# Patient Record
Sex: Female | Born: 1940 | Race: White | Hispanic: No | Marital: Married | Smoking: Former smoker
Health system: Southern US, Community
[De-identification: ages and names within clinical notes are randomized; demographics above are authoritative.]

## PROBLEM LIST (undated history)

## (undated) DIAGNOSIS — E785 Hyperlipidemia, unspecified: Secondary | ICD-10-CM

## (undated) DIAGNOSIS — K635 Polyp of colon: Secondary | ICD-10-CM

## (undated) DIAGNOSIS — I10 Essential (primary) hypertension: Secondary | ICD-10-CM

## (undated) DIAGNOSIS — N200 Calculus of kidney: Secondary | ICD-10-CM

## (undated) DIAGNOSIS — B019 Varicella without complication: Secondary | ICD-10-CM

## (undated) DIAGNOSIS — M1990 Unspecified osteoarthritis, unspecified site: Secondary | ICD-10-CM

## (undated) DIAGNOSIS — T7840XA Allergy, unspecified, initial encounter: Secondary | ICD-10-CM

## (undated) HISTORY — DX: Varicella without complication: B01.9

## (undated) HISTORY — DX: Polyp of colon: K63.5

## (undated) HISTORY — DX: Hyperlipidemia, unspecified: E78.5

## (undated) HISTORY — DX: Unspecified osteoarthritis, unspecified site: M19.90

## (undated) HISTORY — DX: Allergy, unspecified, initial encounter: T78.40XA

## (undated) HISTORY — DX: Calculus of kidney: N20.0

## (undated) HISTORY — DX: Essential (primary) hypertension: I10

---
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
## 2009-05-14 DIAGNOSIS — K635 Polyp of colon: Secondary | ICD-10-CM

## 2009-05-14 HISTORY — DX: Polyp of colon: K63.5

## 2009-05-14 LAB — HM COLONOSCOPY

## 2010-06-19 LAB — HM DEXA SCAN

## 2011-10-30 LAB — HM MAMMOGRAPHY

## 2013-10-29 ENCOUNTER — Encounter: Payer: Self-pay | Admitting: Adult Health

## 2013-10-29 ENCOUNTER — Encounter (INDEPENDENT_AMBULATORY_CARE_PROVIDER_SITE_OTHER): Payer: Self-pay

## 2013-10-29 ENCOUNTER — Ambulatory Visit (INDEPENDENT_AMBULATORY_CARE_PROVIDER_SITE_OTHER): Payer: Medicare Other | Admitting: Adult Health

## 2013-10-29 VITALS — BP 144/66 | HR 70 | Temp 97.7°F | Resp 16 | Ht 64.25 in | Wt 182.5 lb

## 2013-10-29 DIAGNOSIS — R079 Chest pain, unspecified: Secondary | ICD-10-CM

## 2013-10-29 DIAGNOSIS — I10 Essential (primary) hypertension: Secondary | ICD-10-CM

## 2013-10-29 DIAGNOSIS — E785 Hyperlipidemia, unspecified: Secondary | ICD-10-CM

## 2013-10-29 DIAGNOSIS — Z23 Encounter for immunization: Secondary | ICD-10-CM

## 2013-10-29 DIAGNOSIS — R5383 Other fatigue: Secondary | ICD-10-CM

## 2013-10-29 DIAGNOSIS — Z1239 Encounter for other screening for malignant neoplasm of breast: Secondary | ICD-10-CM

## 2013-10-29 DIAGNOSIS — R5381 Other malaise: Secondary | ICD-10-CM

## 2013-10-29 LAB — CBC WITH DIFFERENTIAL/PLATELET
BASOS PCT: 0.5 % (ref 0.0–3.0)
Basophils Absolute: 0 10*3/uL (ref 0.0–0.1)
EOS PCT: 3.7 % (ref 0.0–5.0)
Eosinophils Absolute: 0.2 10*3/uL (ref 0.0–0.7)
HCT: 41.9 % (ref 36.0–46.0)
HEMOGLOBIN: 14.2 g/dL (ref 12.0–15.0)
Lymphocytes Relative: 34.4 % (ref 12.0–46.0)
Lymphs Abs: 1.9 10*3/uL (ref 0.7–4.0)
MCHC: 33.9 g/dL (ref 30.0–36.0)
MCV: 92.4 fl (ref 78.0–100.0)
MONO ABS: 0.5 10*3/uL (ref 0.1–1.0)
MONOS PCT: 9.9 % (ref 3.0–12.0)
NEUTROS ABS: 2.8 10*3/uL (ref 1.4–7.7)
Neutrophils Relative %: 51.5 % (ref 43.0–77.0)
PLATELETS: 195 10*3/uL (ref 150.0–400.0)
RBC: 4.54 Mil/uL (ref 3.87–5.11)
RDW: 13.2 % (ref 11.5–15.5)
WBC: 5.5 10*3/uL (ref 4.0–10.5)

## 2013-10-29 LAB — LIPID PANEL
Cholesterol: 321 mg/dL — ABNORMAL HIGH (ref 0–200)
HDL: 72.5 mg/dL (ref >39.00)
LDL Cholesterol: 232 mg/dL — ABNORMAL HIGH (ref 0–99)
NonHDL: 248.5
Total CHOL/HDL Ratio: 4
Triglycerides: 82 mg/dL (ref 0.0–149.0)
VLDL: 16.4 mg/dL (ref 0.0–40.0)

## 2013-10-29 LAB — COMPREHENSIVE METABOLIC PANEL
ALT: 22 U/L (ref 0–35)
AST: 23 U/L (ref 0–37)
Albumin: 4.8 g/dL (ref 3.5–5.2)
Alkaline Phosphatase: 60 U/L (ref 39–117)
BUN: 17 mg/dL (ref 6–23)
CALCIUM: 9.8 mg/dL (ref 8.4–10.5)
CO2: 27 meq/L (ref 19–32)
CREATININE: 0.7 mg/dL (ref 0.4–1.2)
Chloride: 105 mEq/L (ref 96–112)
GFR: 84.81 mL/min (ref >60.00)
GLUCOSE: 105 mg/dL — AB (ref 70–99)
Potassium: 4.8 mEq/L (ref 3.5–5.1)
SODIUM: 139 meq/L (ref 135–145)
TOTAL PROTEIN: 7.3 g/dL (ref 6.0–8.3)
Total Bilirubin: 0.8 mg/dL (ref 0.2–1.2)

## 2013-10-29 LAB — VITAMIN B12: Vitamin B-12: 295 pg/mL (ref 211–911)

## 2013-10-29 LAB — TSH: TSH: 2.71 u[IU]/mL (ref 0.35–4.50)

## 2013-10-29 LAB — VITAMIN D 25 HYDROXY (VIT D DEFICIENCY, FRACTURES): VITD: 34.34 ng/mL

## 2013-10-29 MED ORDER — ATORVASTATIN CALCIUM 40 MG PO TABS: 60.0000 mg | ORAL_TABLET | Freq: Every day | ORAL | Status: DC

## 2013-10-29 MED ORDER — LISINOPRIL 5 MG PO TABS: 5.0000 mg | ORAL_TABLET | Freq: Every day | ORAL | Status: DC

## 2013-10-29 MED ORDER — ZOSTER VACCINE LIVE 19400 UNT/0.65ML ~~LOC~~ SOLR: 0.6500 mL | Freq: Once | SUBCUTANEOUS | Status: DC

## 2013-10-29 NOTE — Progress Notes (Signed)
Subjective:    Patient ID: Hannah Lee, female    DOB: 1941-01-31, 73 y.o.   MRN: [id number]  HPI  Pleasant 73 yo caucasian female with a history of arthritis, hyperlipidemia, and hypertension. Presents today to establish care. Recently moved to [facility] from [place], [place] because the weather was warmer. Previously taking lisinopril and atorvastatin. Has not taken medication because she has not had a primary care provider in a while.  Prevention: Colonoscopy - 4.5 years ago (found polyp) Mammogram - 2 years ago PCV13/PPSV23 - Denies having Zoster - Denies having     No Known Allergies  Past Medical History  Diagnosis Date  . Arthritis   . Allergy     Seasonal / Environmental - trees, pollen, dust  . Hyperlipidemia   . Hypertension   . Kidney stones     Told had through test  . Colon polyp 2011    Past Surgical History  Procedure Laterality Date  . Tonsillectomy and adenoidectomy  1951    Family History  Problem Relation Age of Onset  . Hyperlipidemia Father   . Hypertension Father   . Stroke Father   . Cancer Sister     colon cancer  . Alzheimer's disease Sister   . Aortic aneurysm Brother   . Cancer Mother     Gall bladder  . Heart disease Paternal Grandmother   . Heart disease Paternal Grandfather     Review of Systems  Constitutional: Fatigue.  HENT: Negative.  Eyes: Negative.  Respiratory: Negative.  Cardiovascular: Chest pain intermittent, fam hx thoracic aneurysm.  Gastrointestinal: Negative.  Endocrine: Negative.  Genitourinary: Negative.  Musculoskeletal: Negative Skin: Negative. Allergic/Immunologic: Negative.  Neurological: Negative.  Hematological: Negative.  Psychiatric/Behavioral: Negative.       Objective:  BP 144/66  Pulse 70  Temp(Src) 97.7 F (36.5 C) (Oral)  Resp 16  Ht 5' 4.25" (1.632 m)  Wt 182 lb 8 oz (82.781 kg)  BMI 31.08 kg/m2  SpO2 95%   Physical Exam  Constitutional: She is oriented to person, place, and time.  No distress.  HENT:  Head: Normocephalic and atraumatic.  Eyes: Conjunctivae and EOM are normal.  Neck: Normal range of motion. Neck supple.  Cardiovascular: Normal rate, regular rhythm, normal heart sounds and intact distal pulses.  Exam reveals no gallop and no friction rub.   No murmur heard. Pulmonary/Chest: Effort normal and breath sounds normal. No respiratory distress. She has no wheezes. She has no rales.  Musculoskeletal: Normal range of motion.  Neurological: She is alert and oriented to person, place, and time. She has normal reflexes. Coordination normal.  Skin: Skin is warm and dry.  Psychiatric: She has a normal mood and affect. Her behavior is normal. Judgment and thought content normal.      Assessment & Plan:   1. Essential hypertension Controlled on medication. Needs refill. Check labs. Follow - lisinopril (PRINIVIL,ZESTRIL) 5 MG tablet; Take 1 tablet (5 mg total) by mouth daily.  Dispense: 30 tablet; Refill: 6 - Comprehensive metabolic panel  2. HLD (hyperlipidemia) Check labs. Refill medication. Follow - atorvastatin (LIPITOR) 40 MG tablet; Take 1.5 tablets (60 mg total) by mouth daily.  Dispense: 45 tablet; Refill: 6 - Lipid panel  3. Screening for breast cancer Mammogram ordered. - MM DIGITAL SCREENING BILATERAL; Future  4. Chest pain, unspecified chest pain type Have vague chest pain. No shortness of breath associated or syncope. Concerned about fam hx of thoracic aneurysm. Brother died in his 72s from ruptured thoracic  aneurysm. Son diagnosed with thoracic aneurysm found on exam prior to procedure. - CT Chest W Contrast; Future  5. Other fatigue Check labs. Also ordered chest ct described above. - CBC with Differential - TSH - Vit D   - Vitamin B12  6. Need for vaccination with 13-polyvalent pneumococcal conjugate vaccine Prevnar given in clinic today. - Pneumococcal conjugate vaccine 13-valent

## 2013-10-29 NOTE — Patient Instructions (Signed)
   Thank you for choosing [place] [facility] for your health care needs.  Please have your labs drawn prior to leaving the office.  The results will be available through MyChart for your convenience. Please remember to activate this. The activation code is located at the end of this form.  I am referring you for a chest CT for your symptoms of chest pain and family history of thoracic aneurysm.  We will schedule your mammogram. Please see Hoyle Sauer for your scheduling.  Your received your pneumonia vaccine today.  I sent in a prescription for your shingles vaccine to your pharmacy. The pharmacist will administer this.

## 2013-10-29 NOTE — Progress Notes (Signed)
Pre visit review using our clinic review tool, if applicable. No additional management support is needed unless otherwise documented below in the visit note. 

## 2013-10-30 ENCOUNTER — Telehealth: Payer: Self-pay | Admitting: Adult Health

## 2013-10-30 ENCOUNTER — Ambulatory Visit: Payer: Self-pay | Admitting: Adult Health

## 2013-10-30 NOTE — Telephone Encounter (Signed)
Relevant patient education assigned to patient using Emmi. ° °

## 2013-10-31 ENCOUNTER — Encounter: Payer: Self-pay | Admitting: Adult Health

## 2013-10-31 DIAGNOSIS — E785 Hyperlipidemia, unspecified: Secondary | ICD-10-CM | POA: Insufficient documentation

## 2013-10-31 DIAGNOSIS — I10 Essential (primary) hypertension: Secondary | ICD-10-CM | POA: Insufficient documentation

## 2013-11-02 ENCOUNTER — Encounter: Payer: Self-pay | Admitting: Adult Health

## 2013-11-03 ENCOUNTER — Telehealth: Payer: Self-pay | Admitting: *Deleted

## 2013-11-03 NOTE — Telephone Encounter (Signed)
Received PA [facility] from [facility]. Began PA online. Sent mychart message to pt with previous medications tried

## 2013-11-04 ENCOUNTER — Telehealth: Payer: Self-pay | Admitting: Adult Health

## 2013-11-04 NOTE — Telephone Encounter (Signed)
Notice of MyChart message not read. Please call pt and advise of her answer for further instruction.

## 2013-11-04 NOTE — Telephone Encounter (Signed)
Received fax from [facility], approved through 05/13/14

## 2013-11-04 NOTE — Telephone Encounter (Signed)
Patient stated that she was fasting the day she had her lab work done. She has not started the atorvastatin yet because it needed a PA. We received notice today that it has been approved. Patient was notified and will be picking up the medication today.

## 2013-11-08 ENCOUNTER — Telehealth: Payer: Self-pay | Admitting: Adult Health

## 2013-11-09 NOTE — Telephone Encounter (Signed)
Chest CT normal

## 2013-11-10 ENCOUNTER — Telehealth: Payer: Self-pay | Admitting: Adult Health

## 2013-11-10 NOTE — Telephone Encounter (Signed)
Pt has not read MyChart message

## 2013-11-10 NOTE — Telephone Encounter (Signed)
Called and advised patient of results

## 2013-11-12 ENCOUNTER — Encounter: Payer: Self-pay | Admitting: *Deleted

## 2013-11-12 ENCOUNTER — Ambulatory Visit: Payer: Self-pay | Admitting: Family Medicine

## 2013-11-17 ENCOUNTER — Encounter: Payer: Self-pay | Admitting: Adult Health

## 2013-11-25 ENCOUNTER — Encounter: Payer: Self-pay | Admitting: *Deleted

## 2013-11-25 ENCOUNTER — Ambulatory Visit: Payer: Self-pay | Admitting: Adult Health

## 2013-11-25 LAB — HM MAMMOGRAPHY: HM Mammogram: NEGATIVE

## 2014-02-25 ENCOUNTER — Other Ambulatory Visit: Payer: Self-pay | Admitting: *Deleted

## 2014-02-25 DIAGNOSIS — I10 Essential (primary) hypertension: Secondary | ICD-10-CM

## 2014-02-25 DIAGNOSIS — E785 Hyperlipidemia, unspecified: Secondary | ICD-10-CM

## 2014-03-24 ENCOUNTER — Telehealth: Payer: Self-pay | Admitting: Adult Health

## 2014-03-24 DIAGNOSIS — E785 Hyperlipidemia, unspecified: Secondary | ICD-10-CM

## 2014-03-24 DIAGNOSIS — I10 Essential (primary) hypertension: Secondary | ICD-10-CM

## 2014-03-24 MED ORDER — ATORVASTATIN CALCIUM 40 MG PO TABS: 60.0000 mg | ORAL_TABLET | Freq: Every day | ORAL | Status: DC

## 2014-03-24 MED ORDER — LISINOPRIL 5 MG PO TABS: 5.0000 mg | ORAL_TABLET | Freq: Every day | ORAL | Status: DC

## 2014-03-24 NOTE — Telephone Encounter (Signed)
Rx sent to pharmacy by escript  

## 2014-03-24 NOTE — Telephone Encounter (Signed)
Hannah Lee stopped by saying she tried having her prescriptions go to [name] but was unable to do so. Per the pt, [facility] tried to contact someone at this location but received no response in regards to her medication. Hannah Lee said you can call [facility] at: [phone]. She knows she'll be come a pt of Hannah Lee if she chooses to do so. She'd like all of her medications to begin going through [facility]. If this can not take place [facility] [place] [facility] [address]. She has about a couple of weeks left of medication. Please call the pt if need be. Pt ph# [phone] Thank you.

## 2014-03-25 ENCOUNTER — Ambulatory Visit: Payer: Medicare Other

## 2014-03-25 ENCOUNTER — Ambulatory Visit (INDEPENDENT_AMBULATORY_CARE_PROVIDER_SITE_OTHER): Payer: Medicare Other | Admitting: *Deleted

## 2014-03-25 DIAGNOSIS — Z23 Encounter for immunization: Secondary | ICD-10-CM

## 2015-04-18 ENCOUNTER — Ambulatory Visit (INDEPENDENT_AMBULATORY_CARE_PROVIDER_SITE_OTHER): Payer: Medicare Other | Admitting: Podiatry

## 2015-04-18 ENCOUNTER — Ambulatory Visit: Payer: Self-pay

## 2015-04-18 ENCOUNTER — Encounter: Payer: Self-pay | Admitting: Podiatry

## 2015-04-18 ENCOUNTER — Telehealth: Payer: Self-pay | Admitting: *Deleted

## 2015-04-18 VITALS — BP 154/75 | HR 77 | Resp 16

## 2015-04-18 DIAGNOSIS — L600 Ingrowing nail: Secondary | ICD-10-CM

## 2015-04-18 DIAGNOSIS — M2010 Hallux valgus (acquired), unspecified foot: Secondary | ICD-10-CM

## 2015-04-18 DIAGNOSIS — L603 Nail dystrophy: Secondary | ICD-10-CM

## 2015-04-18 MED ORDER — NEOMYCIN-POLYMYXIN-HC 3.5-10000-1 OT SOLN: OTIC | Status: DC

## 2015-04-18 NOTE — Progress Notes (Signed)
   Subjective:    Patient ID: Hannah Lee, female    DOB: 05-28-1940, 74 y.o.   MRN: [id number]  HPI: She presents today complaining of painful ingrown toenail and thickening of the toe nail plate for the past several years. She states is become more painful as time goes on. She is also concerned about bunion deformities bilateral foot.    Review of Systems  HENT: Positive for sinus pressure.   All other systems reviewed and are negative.      Objective:   Physical Exam: She presents today as a 74 year old white female vital signs stable mild hypertension alert and oriented 3 in no apparent distress. Pulses are strongly palpable bilateral. Capillary fill time to digits 1 through 5 is immediate. Neurologic sensorium is intact per Semmes-Weinstein monofilament. Deep tendon reflexes are intact bilateral and muscle strength +5 over 5 dorsiflexion plantar flexors and inverters everters bulges musculatures intact. Orthopedic evaluation joints distal to the ankle and full range of motion without crepitation. Cutaneous evaluation demonstrates supple well-hydrated cutis no erythema edema cellulitis drainage or odor. She has sharp incurvated nail margin to the tibia-fibula border of the hallux nail plate right. Its is exquisitely tender on palpation with mild surrounding erythema no purulence or no malodor. Orthopedic exam also does demonstrate mild hallux valgus deformities left greater than right with the tailor bunion deformity left greater than right as well. All hammertoe deformity second digit left foot.        Assessment & Plan:  Assessment: Ingrown nail paronychia abscess hallux right. 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  Plan: Discussed etiology pathology conservative versus surgical therapies. Performed a chemical matrixectomy to the tibial and fibular border of the hallux right today performed without complications after local anesthesia was administered. She tolerated  this procedure well and was provided with oral and written home-going instructions for care and soaking of her toe. She is also provided with a prescription for Cortisporin Otic to be applied twice daily after soaking. We did discuss the possible need for surgical intervention regarding a bunion and tailor's bunion deformities. I will follow-up with her in 1 week to recheck the toe.  Roselind Messier DPM

## 2015-04-18 NOTE — Telephone Encounter (Signed)
Pt states the [facility] from this morning [facility] [address]  I tried to contact pt to tell her I had [facility] [address] [facility] of [address].

## 2015-04-18 NOTE — Patient Instructions (Signed)

## 2015-04-27 ENCOUNTER — Encounter: Payer: Self-pay | Admitting: Podiatry

## 2015-04-27 ENCOUNTER — Ambulatory Visit (INDEPENDENT_AMBULATORY_CARE_PROVIDER_SITE_OTHER): Payer: Medicare Other | Admitting: Podiatry

## 2015-04-27 DIAGNOSIS — L600 Ingrowing nail: Secondary | ICD-10-CM

## 2015-04-27 MED ORDER — DOXYCYCLINE HYCLATE 100 MG PO TABS: 100.0000 mg | ORAL_TABLET | Freq: Two times a day (BID) | ORAL | Status: DC

## 2015-04-27 NOTE — Patient Instructions (Signed)

## 2015-04-27 NOTE — Progress Notes (Signed)
She presents today for follow-up of her matrixectomy hallux right. She states that she continues to soak in Betadine warm water apply Cortisporin otic twice daily and cover twice daily. He states this seems to be doing very well and feels much better than it did prior to surgery.  Objective: Vital signs are stable alert and oriented 3. Pulses are strongly palpable. Neurologic sensorium is intact. Deep tendon reflexes intact. Hallux does not demonstrate edema cellulitis drainage or odor. Margins appear to be well coapted. There is mild erythema to the proximal nail fold possibly associated with just inflammation or very mild paronychia.  Assessment: Well healing matrixectomy. Mild paronychia that is post matrixectomy hallux right.  Plan: Discontinue Betadine sterile with Epsom salts and warm water soaks twice daily continue Cortisporin otic twice daily covered during the daytime and leave open at night. We also requested that she start an antibiotic doxycycline 100 mg twice daily 10 days. Should her erythema worsens development of drainage or pain she is to notify us immediately. Otherwise I will follow up with her as needed.

## 2015-05-18 DIAGNOSIS — L853 Xerosis cutis: Secondary | ICD-10-CM | POA: Diagnosis not present

## 2015-05-18 DIAGNOSIS — L600 Ingrowing nail: Secondary | ICD-10-CM | POA: Diagnosis not present

## 2015-05-18 DIAGNOSIS — L570 Actinic keratosis: Secondary | ICD-10-CM | POA: Diagnosis not present

## 2015-07-11 DIAGNOSIS — Z961 Presence of intraocular lens: Secondary | ICD-10-CM | POA: Diagnosis not present

## 2015-11-04 ENCOUNTER — Ambulatory Visit: Payer: Self-pay | Admitting: Family Medicine

## 2015-11-23 ENCOUNTER — Encounter: Payer: Self-pay | Admitting: Family Medicine

## 2015-11-23 ENCOUNTER — Ambulatory Visit (INDEPENDENT_AMBULATORY_CARE_PROVIDER_SITE_OTHER): Payer: Medicare Other | Admitting: Family Medicine

## 2015-11-23 ENCOUNTER — Other Ambulatory Visit: Payer: Self-pay | Admitting: Family Medicine

## 2015-11-23 VITALS — BP 152/72 | HR 85 | Temp 98.2°F | Ht 65.0 in | Wt 180.2 lb

## 2015-11-23 DIAGNOSIS — Z1239 Encounter for other screening for malignant neoplasm of breast: Secondary | ICD-10-CM

## 2015-11-23 DIAGNOSIS — R6889 Other general symptoms and signs: Secondary | ICD-10-CM

## 2015-11-23 DIAGNOSIS — Z23 Encounter for immunization: Secondary | ICD-10-CM

## 2015-11-23 DIAGNOSIS — E785 Hyperlipidemia, unspecified: Secondary | ICD-10-CM | POA: Diagnosis not present

## 2015-11-23 DIAGNOSIS — I10 Essential (primary) hypertension: Secondary | ICD-10-CM

## 2015-11-23 DIAGNOSIS — Z0001 Encounter for general adult medical examination with abnormal findings: Secondary | ICD-10-CM | POA: Diagnosis not present

## 2015-11-23 DIAGNOSIS — R7309 Other abnormal glucose: Secondary | ICD-10-CM

## 2015-11-23 DIAGNOSIS — Z0000 Encounter for general adult medical examination without abnormal findings: Secondary | ICD-10-CM | POA: Insufficient documentation

## 2015-11-23 LAB — LIPID PANEL
CHOL/HDL RATIO: 3
Cholesterol: 220 mg/dL — ABNORMAL HIGH (ref 0–200)
HDL: 65.7 mg/dL (ref >39.00)
LDL Cholesterol: 133 mg/dL — ABNORMAL HIGH (ref 0–99)
NONHDL: 154.09
Triglycerides: 104 mg/dL (ref 0.0–149.0)
VLDL: 20.8 mg/dL (ref 0.0–40.0)

## 2015-11-23 LAB — COMPREHENSIVE METABOLIC PANEL
ALBUMIN: 4.7 g/dL (ref 3.5–5.2)
ALK PHOS: 66 U/L (ref 39–117)
ALT: 27 U/L (ref 0–35)
AST: 26 U/L (ref 0–37)
BILIRUBIN TOTAL: 0.8 mg/dL (ref 0.2–1.2)
BUN: 19 mg/dL (ref 6–23)
CO2: 27 mEq/L (ref 19–32)
Calcium: 9.9 mg/dL (ref 8.4–10.5)
Chloride: 102 mEq/L (ref 96–112)
Creatinine, Ser: 0.76 mg/dL (ref 0.40–1.20)
GFR: 79.22 mL/min (ref >60.00)
GLUCOSE: 111 mg/dL — AB (ref 70–99)
POTASSIUM: 4.3 meq/L (ref 3.5–5.1)
SODIUM: 136 meq/L (ref 135–145)
TOTAL PROTEIN: 7.5 g/dL (ref 6.0–8.3)

## 2015-11-23 LAB — HEMOGLOBIN A1C: HEMOGLOBIN A1C: 5.6 % (ref 4.6–6.5)

## 2015-11-23 MED ORDER — LISINOPRIL 5 MG PO TABS: 5.0000 mg | ORAL_TABLET | Freq: Every day | ORAL | Status: DC

## 2015-11-23 MED ORDER — ROSUVASTATIN CALCIUM 20 MG PO TABS: 20.0000 mg | ORAL_TABLET | Freq: Every day | ORAL | Status: DC

## 2015-11-23 MED ORDER — ZOSTER VACCINE LIVE 19400 UNT/0.65ML ~~LOC~~ SUSR: 0.6500 mL | Freq: Once | SUBCUTANEOUS | Status: DC

## 2015-11-23 NOTE — Assessment & Plan Note (Signed)
Uncontrolled. Restarting lisinopril. BMP in 7-10 days.

## 2015-11-23 NOTE — Assessment & Plan Note (Signed)
Pneumovax given today. Mammogram ordered and scheduled. Unclear last time she's had a tetanus. Rx for Zostavax given. Remainder of preventative healthcare up-to-date.

## 2015-11-23 NOTE — Progress Notes (Signed)
Pre visit review using our clinic review tool, if applicable. No additional management support is needed unless otherwise documented below in the visit note. 

## 2015-11-23 NOTE — Progress Notes (Signed)
Subjective:  Patient ID: Hannah Lee, female    DOB: 07/17/1940  Age: 75 y.o. MRN: [id number]  CC: Reestablish, HTN, HLD  HPI Alfredia Desanctis is a 75 y.o. female presents to the clinic today to reestablish.  Preventative Healthcare  Pap smear: No longer needed due to age.  Mammogram: In need of later this year. Will order and schedule today.  Colonoscopy: Up to date.  Immunizations  Tetanus - Unsure.  Pneumococcal - In need of Pneumovax today.  Zoster - In need of. Will readdress.  Labs: Labs today.  Alcohol use: See below.  Smoking/tobacco use: Former smoker.  HTN  Uncontrolled.  She's been out of her medication for 6 months.  She was previously on lisinopril 5 mg daily.  She is in need a refill today.  HLD  Uncontrolled. Lipid Panel     Component Value Date/Time   CHOL 321* 10/29/2013 0925   TRIG 82.0 10/29/2013 0925   HDL 72.50 10/29/2013 0925   CHOLHDL 4 10/29/2013 0925   VLDL 16.4 10/29/2013 0925   LDLCALC 232* 10/29/2013 0925    Last lipid panel revealed markedly elevated cholesterol.  She has previously been on Lipitor.  Need labs today.  PMH, Surgical Hx, Family Hx, Social History reviewed and updated as below.  Past Medical History  Diagnosis Date  . Arthritis   . Allergy     Seasonal / Environmental - trees, pollen, dust  . Hyperlipidemia   . Hypertension   . Kidney stones     Told had through test  . Colon polyp 2011    Colonoscopy  . Chicken pox    Past Surgical History  Procedure Laterality Date  . Tonsillectomy and adenoidectomy  1951   Family History  Problem Relation Age of Onset  . Hyperlipidemia Father   . Hypertension Father   . Stroke Father   . Cancer Sister     colon cancer  . Alzheimer's disease Sister   . Aortic aneurysm Brother 43    Ruptured Aneurysm  . Cancer Mother     Gall bladder  . Heart disease Paternal Grandmother   . Heart disease Paternal Grandfather   . Cancer Daughter 42    died of  leukemia  . Aortic aneurysm Son    Social History  Substance Use Topics  . Smoking status: Former Smoker -- 2 years    Quit date: 10/30/1963  . Smokeless tobacco: Never Used  . Alcohol Use: 3.5 oz/week    7 Standard drinks or equivalent per week     Comment: Wine - about a glass a day   Review of Systems  Musculoskeletal: Positive for myalgias and arthralgias.  All other systems reviewed and are negative.  Objective:   Today's Vitals: BP 152/72 mmHg  Pulse 85  Temp(Src) 98.2 F (36.8 C) (Oral)  Ht 5' 5" (1.651 m)  Wt 180 lb 4 oz (81.761 kg)  BMI 30.00 kg/m2  SpO2 94%  Physical Exam  Constitutional: She is oriented to person, place, and time. She appears well-developed and well-nourished. No distress.  HENT:  Head: Normocephalic and atraumatic.  Nose: Nose normal.  Mouth/Throat: Oropharynx is clear and moist. No oropharyngeal exudate.  Normal TM's bilaterally.   Eyes: Conjunctivae are normal. No scleral icterus.  Neck: Neck supple. No thyromegaly present.  Cardiovascular: Normal rate and regular rhythm.   No murmur heard. Pulmonary/Chest: Effort normal and breath sounds normal. She has no wheezes. She has no rales.  Abdominal: Soft. She exhibits no  distension. There is no tenderness. There is no rebound and no guarding.  Musculoskeletal: Normal range of motion. She exhibits no edema.  Lymphadenopathy:    She has no cervical adenopathy.  Neurological: She is alert and oriented to person, place, and time.  Skin: Skin is warm and dry. No rash noted.  Psychiatric: She has a normal mood and affect.  Vitals reviewed.  Assessment & Plan:   Problem List Items Addressed This Visit    Hyperlipidemia    Uncontrolled. Repeating lipid panel today. Based on results will start statin therapy. Based on priors, patient will need Lee-dose Crestor or lipitor.      Relevant Medications   lisinopril (PRINIVIL,ZESTRIL) 5 MG tablet   Other Relevant Orders   Lipid Profile    Essential hypertension    Uncontrolled. Restarting lisinopril. BMP in 7-10 days.      Relevant Medications   lisinopril (PRINIVIL,ZESTRIL) 5 MG tablet   Other Relevant Orders   Comp Met (CMET)   Basic Metabolic Panel (BMET)   Encounter for preventative adult health care exam with abnormal findings - Primary    Pneumovax given today. Mammogram ordered and scheduled. Unclear last time she's had a tetanus. Rx for Zostavax given. Remainder of preventative healthcare up-to-date.       Other Visit Diagnoses    Screening for breast cancer        Relevant Orders    MM DIGITAL SCREENING BILATERAL    Elevated glucose        Relevant Orders    HgB A1c    Need for prophylactic vaccination against Streptococcus pneumoniae (pneumococcus)        Relevant Orders    Pneumococcal polysaccharide vaccine 23-valent greater than or equal to 2yo subcutaneous/IM (Completed)      Outpatient Encounter Prescriptions as of 11/23/2015  Medication Sig  . lisinopril (PRINIVIL,ZESTRIL) 5 MG tablet Take 1 tablet (5 mg total) by mouth daily.  . [DISCONTINUED] atorvastatin (LIPITOR) 40 MG tablet Take 1.5 tablets (60 mg total) by mouth daily.  . [DISCONTINUED] lisinopril (PRINIVIL,ZESTRIL) 5 MG tablet Take 1 tablet (5 mg total) by mouth daily.  Marland Kitchen Zoster Vaccine Live, PF, (ZOSTAVAX) 82956 UNT/0.65ML injection Inject 19,400 Units into the skin once.  . [DISCONTINUED] doxycycline (VIBRA-TABS) 100 MG tablet Take 1 tablet (100 mg total) by mouth 2 (two) times daily.  . [DISCONTINUED] neomycin-polymyxin-hydrocortisone (CORTISPORIN) otic solution Apply one to two drops to toe after soaking twice daily.  . [DISCONTINUED] zoster vaccine live, PF, (ZOSTAVAX) 21308 UNT/0.65ML injection Inject 19,400 Units into the skin once.   No facility-administered encounter medications on file as of 11/23/2015.    Follow-up: Follow up for labs; then in 6 months.  [place]

## 2015-11-23 NOTE — Patient Instructions (Addendum)
Be sure to get your mammogram.  We will call with your lab results.  Restart the lisinopril.  Follow up in 1 week for a BP check and Lab.  Then again in 6 months.  Take care  Dr. Lacinda Axon   Health Maintenance, Female Adopting a healthy lifestyle and getting preventive care can go a long way to promote health and wellness. Talk with your health care provider about what schedule of regular examinations is right for you. This is a good chance for you to check in with your provider about disease prevention and staying healthy. In between checkups, there are plenty of things you can do on your own. Experts have done a lot of research about which lifestyle changes and preventive measures are most likely to keep you healthy. Ask your health care provider for more information. WEIGHT AND DIET  Eat a healthy diet  Be sure to include plenty of vegetables, fruits, low-fat dairy products, and lean protein.  Do not eat a lot of foods high in solid fats, added sugars, or salt.  Get regular exercise. This is one of the most important things you can do for your health.  Most adults should exercise for at least 150 minutes each week. The exercise should increase your heart rate and make you sweat (moderate-intensity exercise).  Most adults should also do strengthening exercises at least twice a week. This is in addition to the moderate-intensity exercise.  Maintain a healthy weight  Body mass index (BMI) is a measurement that can be used to identify possible weight problems. It estimates body fat based on height and weight. Your health care provider can help determine your BMI and help you achieve or maintain a healthy weight.  For females 44 years of age and older:   A BMI below 18.5 is considered underweight.  A BMI of 18.5 to 24.9 is normal.  A BMI of 25 to 29.9 is considered overweight.  A BMI of 30 and above is considered obese.  Watch levels of cholesterol and blood lipids  You should  start having your blood tested for lipids and cholesterol at 73 years of age, then have this test every 5 years.  You may need to have your cholesterol levels checked more often if:  Your lipid or cholesterol levels are high.  You are older than 73 years of age.  You are at high risk for heart disease.  CANCER SCREENING   Lung Cancer  Lung cancer screening is recommended for adults 66-63 years old who are at high risk for lung cancer because of a history of smoking.  A yearly low-dose CT scan of the lungs is recommended for people who:  Currently smoke.  Have quit within the past 15 years.  Have at least a 30-pack-year history of smoking. A pack year is smoking an average of one pack of cigarettes a day for 1 year.  Yearly screening should continue until it has been 15 years since you quit.  Yearly screening should stop if you develop a health problem that would prevent you from having lung cancer treatment.  Breast Cancer  Practice breast self-awareness. This means understanding how your breasts normally appear and feel.  It also means doing regular breast self-exams. Let your health care provider know about any changes, no matter how small.  If you are in your 20s or 30s, you should have a clinical breast exam (CBE) by a health care provider every 1-3 years as part of a regular health  exam.  If you are 40 or older, have a CBE every year. Also consider having a breast X-ray (mammogram) every year.  If you have a family history of breast cancer, talk to your health care provider about genetic screening.  If you are at high risk for breast cancer, talk to your health care provider about having an MRI and a mammogram every year.  Breast cancer gene (BRCA) assessment is recommended for women who have family members with BRCA-related cancers. BRCA-related cancers include:  Breast.  Ovarian.  Tubal.  Peritoneal cancers.  Results of the assessment will determine the  need for genetic counseling and BRCA1 and BRCA2 testing. Cervical Cancer Your health care provider may recommend that you be screened regularly for cancer of the pelvic organs (ovaries, uterus, and vagina). This screening involves a pelvic examination, including checking for microscopic changes to the surface of your cervix (Pap test). You may be encouraged to have this screening done every 3 years, beginning at age 75.  For women ages 62-65, health care providers may recommend pelvic exams and Pap testing every 3 years, or they may recommend the Pap and pelvic exam, combined with testing for human papilloma virus (HPV), every 5 years. Some types of HPV increase your risk of cervical cancer. Testing for HPV may also be done on women of any age with unclear Pap test results.  Other health care providers may not recommend any screening for nonpregnant women who are considered low risk for pelvic cancer and who do not have symptoms. Ask your health care provider if a screening pelvic exam is right for you.  If you have had past treatment for cervical cancer or a condition that could lead to cancer, you need Pap tests and screening for cancer for at least 20 years after your treatment. If Pap tests have been discontinued, your risk factors (such as having a new sexual partner) need to be reassessed to determine if screening should resume. Some women have medical problems that increase the chance of getting cervical cancer. In these cases, your health care provider may recommend more frequent screening and Pap tests. Colorectal Cancer  This type of cancer can be detected and often prevented.  Routine colorectal cancer screening usually begins at 73 years of age and continues through 73 years of age.  Your health care provider may recommend screening at an earlier age if you have risk factors for colon cancer.  Your health care provider may also recommend using home test kits to check for hidden blood in  the stool.  A small camera at the end of a tube can be used to examine your colon directly (sigmoidoscopy or colonoscopy). This is done to check for the earliest forms of colorectal cancer.  Routine screening usually begins at age 44.  Direct examination of the colon should be repeated every 5-10 years through 73 years of age. However, you may need to be screened more often if early forms of precancerous polyps or small growths are found. Skin Cancer  Check your skin from head to toe regularly.  Tell your health care provider about any new moles or changes in moles, especially if there is a change in a mole's shape or color.  Also tell your health care provider if you have a mole that is larger than the size of a pencil eraser.  Always use sunscreen. Apply sunscreen liberally and repeatedly throughout the day.  Protect yourself by wearing long sleeves, pants, a wide-brimmed hat, and sunglasses  whenever you are outside. HEART DISEASE, DIABETES, AND HIGH BLOOD PRESSURE   High blood pressure causes heart disease and increases the risk of stroke. High blood pressure is more likely to develop in:  People who have blood pressure in the high end of the normal range (130-139/85-89 mm Hg).  People who are overweight or obese.  People who are African American.  If you are 58-2 years of age, have your blood pressure checked every 3-5 years. If you are 80 years of age or older, have your blood pressure checked every year. You should have your blood pressure measured twice--once when you are at a hospital or clinic, and once when you are not at a hospital or clinic. Record the average of the two measurements. To check your blood pressure when you are not at a hospital or clinic, you can use:  An automated blood pressure machine at a pharmacy.  A home blood pressure monitor.  If you are between 55 years and 55 years old, ask your health care provider if you should take aspirin to prevent  strokes.  Have regular diabetes screenings. This involves taking a blood sample to check your fasting blood sugar level.  If you are at a normal weight and have a low risk for diabetes, have this test once every three years after 73 years of age.  If you are overweight and have a high risk for diabetes, consider being tested at a younger age or more often. PREVENTING INFECTION  Hepatitis B  If you have a higher risk for hepatitis B, you should be screened for this virus. You are considered at high risk for hepatitis B if:  You were born in a country where hepatitis B is common. Ask your health care provider which countries are considered high risk.  Your parents were born in a high-risk country, and you have not been immunized against hepatitis B (hepatitis B vaccine).  You have HIV or AIDS.  You use needles to inject street drugs.  You live with someone who has hepatitis B.  You have had sex with someone who has hepatitis B.  You get hemodialysis treatment.  You take certain medicines for conditions, including cancer, organ transplantation, and autoimmune conditions. Hepatitis C  Blood testing is recommended for:  Everyone born from [birth year] through 1963.  Anyone with known risk factors for hepatitis C. Sexually transmitted infections (STIs)  You should be screened for sexually transmitted infections (STIs) including gonorrhea and chlamydia if:  You are sexually active and are younger than 73 years of age.  You are older than 73 years of age and your health care provider tells you that you are at risk for this type of infection.  Your sexual activity has changed since you were last screened and you are at an increased risk for chlamydia or gonorrhea. Ask your health care provider if you are at risk.  If you do not have HIV, but are at risk, it may be recommended that you take a prescription medicine daily to prevent HIV infection. This is called pre-exposure prophylaxis  (PrEP). You are considered at risk if:  You are sexually active and do not regularly use condoms or know the HIV status of your partner(s).  You take drugs by injection.  You are sexually active with a partner who has HIV. Talk with your health care provider about whether you are at high risk of being infected with HIV. If you choose to begin PrEP, you should first be  tested for HIV. You should then be tested every 3 months for as long as you are taking PrEP.  PREGNANCY   If you are premenopausal and you may become pregnant, ask your health care provider about preconception counseling.  If you may become pregnant, take 400 to 800 micrograms (mcg) of folic acid every day.  If you want to prevent pregnancy, talk to your health care provider about birth control (contraception). OSTEOPOROSIS AND MENOPAUSE   Osteoporosis is a disease in which the bones lose minerals and strength with aging. This can result in serious bone fractures. Your risk for osteoporosis can be identified using a bone density scan.  If you are 37 years of age or older, or if you are at risk for osteoporosis and fractures, ask your health care provider if you should be screened.  Ask your health care provider whether you should take a calcium or vitamin D supplement to lower your risk for osteoporosis.  Menopause may have certain physical symptoms and risks.  Hormone replacement therapy may reduce some of these symptoms and risks. Talk to your health care provider about whether hormone replacement therapy is right for you.  HOME CARE INSTRUCTIONS   Schedule regular health, dental, and eye exams.  Stay current with your immunizations.   Do not use any tobacco products including cigarettes, chewing tobacco, or electronic cigarettes.  If you are pregnant, do not drink alcohol.  If you are breastfeeding, limit how much and how often you drink alcohol.  Limit alcohol intake to no more than 1 drink per day for  nonpregnant women. One drink equals 12 ounces of beer, 5 ounces of wine, or 1 ounces of hard liquor.  Do not use street drugs.  Do not share needles.  Ask your health care provider for help if you need support or information about quitting drugs.  Tell your health care provider if you often feel depressed.  Tell your health care provider if you have ever been abused or do not feel safe at home.   This information is not intended to replace advice given to you by your health care provider. Make sure you discuss any questions you have with your health care provider.   Document Released: 11/13/2010 Document Revised: 05/21/2014 Document Reviewed: 04/01/2013 Elsevier Interactive Patient Education [employer].

## 2015-11-23 NOTE — Assessment & Plan Note (Signed)
Uncontrolled. Repeating lipid panel today. Based on results will start statin therapy. Based on priors, patient will need high-dose Crestor or lipitor.

## 2015-11-24 ENCOUNTER — Other Ambulatory Visit: Payer: Self-pay | Admitting: Family Medicine

## 2015-11-24 MED ORDER — ROSUVASTATIN CALCIUM 20 MG PO TABS: 20.0000 mg | ORAL_TABLET | Freq: Every day | ORAL | Status: DC

## 2015-11-24 NOTE — Telephone Encounter (Signed)
Patient wanted 30-day sent to local pharmacy.

## 2015-12-05 ENCOUNTER — Other Ambulatory Visit: Payer: Medicare Other

## 2015-12-06 ENCOUNTER — Ambulatory Visit (INDEPENDENT_AMBULATORY_CARE_PROVIDER_SITE_OTHER): Payer: Medicare Other

## 2015-12-06 ENCOUNTER — Other Ambulatory Visit: Payer: Self-pay | Admitting: Family Medicine

## 2015-12-06 ENCOUNTER — Other Ambulatory Visit (INDEPENDENT_AMBULATORY_CARE_PROVIDER_SITE_OTHER): Payer: Medicare Other

## 2015-12-06 VITALS — BP 152/85 | HR 74

## 2015-12-06 DIAGNOSIS — I10 Essential (primary) hypertension: Secondary | ICD-10-CM

## 2015-12-06 LAB — BASIC METABOLIC PANEL
BUN: 19 mg/dL (ref 6–23)
CHLORIDE: 103 meq/L (ref 96–112)
CO2: 26 mEq/L (ref 19–32)
CREATININE: 0.73 mg/dL (ref 0.40–1.20)
Calcium: 9.7 mg/dL (ref 8.4–10.5)
GFR: 82.98 mL/min (ref >60.00)
Glucose, Bld: 95 mg/dL (ref 70–99)
Potassium: 4.5 mEq/L (ref 3.5–5.1)
Sodium: 137 mEq/L (ref 135–145)

## 2015-12-06 MED ORDER — ROSUVASTATIN CALCIUM 20 MG PO TABS: 20.0000 mg | ORAL_TABLET | Freq: Every day | ORAL | 0 refills | Status: DC

## 2015-12-06 MED ORDER — ROSUVASTATIN CALCIUM 20 MG PO TABS: 20.0000 mg | ORAL_TABLET | Freq: Every day | ORAL | 3 refills | Status: DC

## 2015-12-06 NOTE — Progress Notes (Signed)
Patient was in today for a BP check . Patient stated she is taking medication has prescribed. Patient also stated that Pharmacy never received her Crestor so she asked for it to be resent in.

## 2015-12-08 NOTE — Progress Notes (Signed)
Noted. BP remains elevated. Would suggest increasing lisinopril to 10 mg, though will forward to patients PCP.   Tommi Rumps, MD

## 2015-12-09 ENCOUNTER — Other Ambulatory Visit: Payer: Self-pay | Admitting: Family Medicine

## 2015-12-09 ENCOUNTER — Ambulatory Visit
Admission: RE | Admit: 2015-12-09 | Discharge: 2015-12-09 | Disposition: A | Discharge status: Home / Self Care | Payer: Medicare Other | Source: Ambulatory Visit | Attending: Family Medicine | Admitting: Family Medicine

## 2015-12-09 DIAGNOSIS — Z1239 Encounter for other screening for malignant neoplasm of breast: Secondary | ICD-10-CM

## 2015-12-09 DIAGNOSIS — Z1231 Encounter for screening mammogram for malignant neoplasm of breast: Secondary | ICD-10-CM | POA: Diagnosis not present

## 2015-12-15 ENCOUNTER — Telehealth: Payer: Self-pay

## 2015-12-15 NOTE — Telephone Encounter (Signed)
Pt coming for labs 12/16/15. Looks like possible recheck of lipids. Please review and place future order. Thank you.

## 2015-12-15 NOTE — Telephone Encounter (Signed)
I'm not sure what she is coming in for.

## 2015-12-16 ENCOUNTER — Other Ambulatory Visit: Payer: Medicare Other

## 2016-01-06 ENCOUNTER — Other Ambulatory Visit: Payer: Self-pay | Admitting: Family Medicine

## 2016-01-06 DIAGNOSIS — I10 Essential (primary) hypertension: Secondary | ICD-10-CM

## 2016-01-06 NOTE — Telephone Encounter (Signed)
Please advise 

## 2016-01-06 NOTE — Telephone Encounter (Signed)
Hannah Lee called saying a Rx for Lisinopril needs to be sent to [facility] for 10mg  instead of 5. Please call her regarding this if needed.  Pt's ph# [phone] Thank you.

## 2016-01-07 NOTE — Telephone Encounter (Signed)
This can be refilled. Please use refill protocol for med refills.

## 2016-01-09 MED ORDER — LISINOPRIL 10 MG PO TABS: 5.0000 mg | ORAL_TABLET | Freq: Every day | ORAL | 2 refills | Status: DC

## 2016-01-09 NOTE — Telephone Encounter (Signed)
Refilled based off last result note from BP check. Sent to [facility]. Thanks

## 2016-03-16 ENCOUNTER — Other Ambulatory Visit: Payer: Medicare Other

## 2016-03-27 ENCOUNTER — Other Ambulatory Visit: Payer: Medicare Other

## 2016-04-27 ENCOUNTER — Other Ambulatory Visit: Payer: Self-pay | Admitting: Family Medicine

## 2016-04-27 ENCOUNTER — Telehealth: Payer: Self-pay

## 2016-04-27 DIAGNOSIS — E785 Hyperlipidemia, unspecified: Secondary | ICD-10-CM

## 2016-04-27 NOTE — Telephone Encounter (Signed)
Pt coming for repeat fasting labs 04/30/16. Please place future orders. Thank you.

## 2016-04-30 ENCOUNTER — Other Ambulatory Visit (INDEPENDENT_AMBULATORY_CARE_PROVIDER_SITE_OTHER): Payer: Medicare Other

## 2016-04-30 DIAGNOSIS — E785 Hyperlipidemia, unspecified: Secondary | ICD-10-CM | POA: Diagnosis not present

## 2016-04-30 LAB — LDL CHOLESTEROL, DIRECT: LDL DIRECT: 113 mg/dL

## 2016-04-30 LAB — LIPID PANEL
Cholesterol: 220 mg/dL — ABNORMAL HIGH (ref 0–200)
HDL: 66.3 mg/dL (ref >39.00)
NONHDL: 153.96
TRIGLYCERIDES: 297 mg/dL — AB (ref 0.0–149.0)
Total CHOL/HDL Ratio: 3
VLDL: 59.4 mg/dL — AB (ref 0.0–40.0)

## 2016-05-17 ENCOUNTER — Other Ambulatory Visit: Payer: Self-pay | Admitting: Family Medicine

## 2016-05-17 MED ORDER — ROSUVASTATIN CALCIUM 40 MG PO TABS: 40.0000 mg | ORAL_TABLET | Freq: Every day | ORAL | 3 refills | Status: DC

## 2016-05-25 ENCOUNTER — Ambulatory Visit: Payer: Medicare Other | Admitting: Family Medicine

## 2016-07-05 ENCOUNTER — Ambulatory Visit (INDEPENDENT_AMBULATORY_CARE_PROVIDER_SITE_OTHER): Payer: Medicare Other

## 2016-07-05 DIAGNOSIS — Z23 Encounter for immunization: Secondary | ICD-10-CM

## 2016-08-24 ENCOUNTER — Other Ambulatory Visit: Payer: Self-pay | Admitting: Family Medicine

## 2016-08-24 DIAGNOSIS — I10 Essential (primary) hypertension: Secondary | ICD-10-CM

## 2016-08-24 MED ORDER — LISINOPRIL 10 MG PO TABS: 5.0000 mg | ORAL_TABLET | Freq: Every day | ORAL | 2 refills | Status: DC

## 2016-10-09 ENCOUNTER — Telehealth: Payer: Self-pay | Admitting: Family Medicine

## 2016-10-09 ENCOUNTER — Telehealth: Payer: Self-pay

## 2016-10-09 DIAGNOSIS — I10 Essential (primary) hypertension: Secondary | ICD-10-CM

## 2016-10-09 MED ORDER — LISINOPRIL 10 MG PO TABS: 5.0000 mg | ORAL_TABLET | Freq: Every day | ORAL | 2 refills | Status: DC

## 2016-10-09 NOTE — Telephone Encounter (Signed)
Refilled rx, called patient to notify

## 2016-10-09 NOTE — Telephone Encounter (Signed)
Pt called and stated that she needs a refill on her lisinopril (PRINIVIL,ZESTRIL) 10 MG tablet. Pt would like to be call once completed. Thank you!  [place], Montague Montgomery

## 2016-10-09 NOTE — Telephone Encounter (Signed)
Fax received from [facility] states patient is currently on Crestor 40 mg she requesting 20 mg , old script.  Called and spoke with pharmacist Tim and verified patient is Crestor 40 mg according to last labs 04/30/16 .

## 2016-10-26 ENCOUNTER — Other Ambulatory Visit: Payer: Self-pay

## 2016-10-26 ENCOUNTER — Ambulatory Visit (INDEPENDENT_AMBULATORY_CARE_PROVIDER_SITE_OTHER): Payer: Medicare Other

## 2016-10-26 VITALS — BP 120/80 | HR 68 | Temp 98.4°F | Resp 12 | Ht 65.0 in | Wt 184.8 lb

## 2016-10-26 DIAGNOSIS — Z0000 Encounter for general adult medical examination without abnormal findings: Secondary | ICD-10-CM

## 2016-10-26 DIAGNOSIS — I10 Essential (primary) hypertension: Secondary | ICD-10-CM

## 2016-10-26 MED ORDER — LISINOPRIL 10 MG PO TABS: 10.0000 mg | ORAL_TABLET | Freq: Every day | ORAL | 0 refills | Status: DC

## 2016-10-26 NOTE — Progress Notes (Signed)
Subjective:   Hannah Lee is a 76 y.o. female who presents for an Initial Medicare Annual Wellness Visit.  Review of Systems    No ROS.  Medicare Wellness Visit. Additional risk factors are reflected in the social history.  Cardiac Risk Factors include: advanced age (>47men, >69 women);hypertension     Objective:    Today's Vitals   10/26/16 0829  BP: 120/80  Pulse: 68  Resp: 12  Temp: 98.4 F (36.9 C)  TempSrc: Oral  SpO2: 95%  Weight: 184 lb 12.8 oz (83.8 kg)  Height: 5\' 5"  (1.651 m)   Body mass index is 30.75 kg/m.   Current Medications (verified) Outpatient Encounter Prescriptions as of 10/26/2016  Medication Sig  . rosuvastatin (CRESTOR) 40 MG tablet Take 1 tablet (40 mg total) by mouth daily.  . [DISCONTINUED] lisinopril (PRINIVIL,ZESTRIL) 10 MG tablet Take 0.5 tablets (5 mg total) by mouth daily.  Marland Kitchen Zoster Vaccine Live, PF, (ZOSTAVAX) 43154 UNT/0.65ML injection Inject 19,400 Units into the skin once. (Patient not taking: Reported on 10/26/2016)   No facility-administered encounter medications on file as of 10/26/2016.     Allergies (verified) Patient has no known allergies.   History: Past Medical History:  Diagnosis Date  . Allergy    Seasonal / Environmental - trees, pollen, dust  . Arthritis   . Chicken pox   . Colon polyp 2011   Colonoscopy  . Hyperlipidemia   . Hypertension   . Kidney stones    Told had through test   Past Surgical History:  Procedure Laterality Date  . TONSILLECTOMY AND ADENOIDECTOMY  1951   Family History  Problem Relation Age of Onset  . Hyperlipidemia Father   . Hypertension Father   . Stroke Father   . Cancer Sister        colon cancer  . Alzheimer's disease Sister   . Aortic aneurysm Brother 43       Ruptured Aneurysm  . Cancer Mother        Gall bladder  . Heart disease Paternal Grandmother   . Heart disease Paternal Grandfather   . Cancer Daughter 77       died of leukemia  . Aortic aneurysm Son     Social History   Occupational History  . Retired     Theme park manager   Social History Main Topics  . Smoking status: Former Smoker    Years: 2.00    Quit date: 10/30/1963  . Smokeless tobacco: Never Used  . Alcohol use 3.5 oz/week    7 Standard drinks or equivalent per week     Comment: Wine - about a glass a day  . Drug use: No  . Sexual activity: No    Tobacco Counseling Counseling given: Not Answered   Activities of Daily Living In your present state of health, do you have any difficulty performing the following activities: 10/26/2016  Hearing? N  Vision? N  Difficulty concentrating or making decisions? N  Walking or climbing stairs? N  Dressing or bathing? N  Doing errands, shopping? N  Preparing Food and eating ? N  Using the Toilet? N  In the past six months, have you accidently leaked urine? N  Do you have problems with loss of bowel control? N  Managing your Medications? N  Managing your Finances? N  Housekeeping or managing your Housekeeping? N  Some recent data might be hidden    Immunizations and Health Maintenance Immunization History  Administered Date(s) Administered  . Influenza, High  Dose Seasonal PF 07/05/2016  . Influenza,inj,Quad PF,36+ Mos 03/25/2014  . Pneumococcal Conjugate-13 10/29/2013  . Pneumococcal Polysaccharide-23 11/23/2015   Health Maintenance Due  Topic Date Due  . Samul Dada  08/04/1959    Patient Care Team: [name], DO as PCP - General (Family Medicine)  Indicate any recent Medical Services you may have received from other than [facility] providers in the past year (date may be approximate).     Assessment:   This is a routine wellness examination for [name]. The goal of the wellness visit is to assist the patient how to close the gaps in care and create a preventative care plan for the patient.   The roster of all physicians providing medical care to patient is listed in the Snapshot section of the  chart.  Osteoporosis risk reviewed.    Safety issues reviewed; Smoke and carbon monoxide detectors in the home. No firearms in the home.  Wears seatbelts when driving or riding with others. Patient does wear sunscreen or protective clothing when in direct sunlight. No violence in the home.  Depression- PHQ 2 &9 complete.  No signs/symptoms or verbal communication regarding little pleasure in doing things, feeling down, depressed or hopeless. No changes in sleeping, energy, eating, concentrating.  No thoughts of self harm or harm towards others.    Patient is alert, normal appearance, oriented to person/place/and time. Correctly identified the president of the [name], recall of 3/3 words, and performing simple calculations. Displays appropriate judgement and can read correct time from watch face.   No new identified risk were noted.  No failures at ADL's or IADL's.    Works part time at [employer] 2 days weekly.  BMI- discussed the importance of a healthy diet, water intake and the benefits of aerobic exercise. Educational material provided.   24 hour diet recall: Breakfast: 2 boiled eggs, ham  Lunch: cottage cheese, fruit Dinner: deli salad Daily fluid intake:  1-2 cups of caffeine, 6 cups of water  Dental- every 6  Months.  Dental works.  Sleep patterns- Sleeps 6 hours at night.  Wakes feeling rested.  TDAP vaccine deferred per patient preference.  Follow up with insurance.  Educational material provided.  Patient Concerns: COLOguard ordered.  Lisinopril reordered.   Hearing/Vision screen Hearing Screening Comments: Patient is able to hear conversational tones without difficulty.  No issues reported.  Vision Screening Comments: Last OV 2017 Cataract extraction, bilateral Visual acuity not assessed per patient preference since they have regular follow up with the ophthalmologist  Dietary issues and exercise activities discussed: Current Exercise Habits: Home exercise routine, Type  of exercise: walking, Intensity: Mild  Goals    . Healthy Lifestyle          Low carb foods Stay active and continue walking for exercise Stay hydrated and drink plenty of fluids      Depression Screen PHQ 2/9 Scores 10/26/2016 10/29/2013  PHQ - 2 Score 0 0    Fall Risk Fall Risk  10/26/2016 10/29/2013  Falls in the past year? No No    Cognitive Function: MMSE - Mini Mental State Exam 10/26/2016  Orientation to time 5  Orientation to Place 5  Registration 3  Attention/ Calculation 5  Recall 3  Language- name 2 objects 2  Language- repeat 1  Language- follow 3 step command 3  Language- read & follow direction 1  Write a sentence 1  Copy design 1  Total score 30        Screening Tests  Health Maintenance  Topic Date Due  . TETANUS/TDAP  08/04/1959  . INFLUENZA VACCINE  12/12/2016  . MAMMOGRAM  12/08/2017  . COLONOSCOPY  05/15/2019  . DEXA SCAN  Completed  . PNA vac Low Risk Adult  Completed      Plan:    End of life planning; Advance aging; Advanced directives discussed. Copy of current HCPOA/Living Will requested.    I have personally reviewed and noted the following in the patient's chart:   . Medical and social history . Use of alcohol, tobacco or illicit drugs  . Current medications and supplements . Functional ability and status . Nutritional status . Physical activity . Advanced directives . List of other physicians . Hospitalizations, surgeries, and ER visits in previous 12 months . Vitals . Screenings to include cognitive, depression, and falls . Referrals and appointments  In addition, I have reviewed and discussed with patient certain preventive protocols, quality metrics, and best practice recommendations. A written personalized care plan for preventive services as well as general preventive health recommendations were provided to patient.     Varney Biles, LPN   [id number]

## 2016-10-26 NOTE — Patient Instructions (Addendum)
  Hannah Lee , Thank you for taking time to come for your Medicare Wellness Visit. I appreciate your ongoing commitment to your health goals. Please review the following plan we discussed and let me know if I can assist you in the future.   Follow up with Dr. Lacinda Axon as needed.    Bring a copy of your [place] and/or Living Will to be scanned into chart.  Lisinopril 10mg , 1 daily sent to pharmacy.    Cologuard ordered, follow as directed.  Annual visit due 11/22/16.  Have a great day!  These are the goals we discussed: Goals    . Healthy Lifestyle          Low carb foods Stay active and continue walking for exercise Stay hydrated and drink plenty of fluids       This is a list of the screening recommended for you and due dates:  Health Maintenance  Topic Date Due  . Tetanus Vaccine  08/04/1959  . Flu Shot  12/12/2016  . Mammogram  12/08/2017  . Colon Cancer Screening  05/15/2019  . DEXA scan (bone density measurement)  Completed  . Pneumonia vaccines  Completed

## 2016-11-08 DIAGNOSIS — Z1212 Encounter for screening for malignant neoplasm of rectum: Secondary | ICD-10-CM | POA: Diagnosis not present

## 2016-11-08 DIAGNOSIS — Z1211 Encounter for screening for malignant neoplasm of colon: Secondary | ICD-10-CM | POA: Diagnosis not present

## 2016-11-10 LAB — COLOGUARD: Cologuard: POSITIVE

## 2016-11-19 ENCOUNTER — Other Ambulatory Visit: Payer: Self-pay

## 2016-11-19 ENCOUNTER — Other Ambulatory Visit: Payer: Self-pay | Admitting: Family Medicine

## 2016-11-19 ENCOUNTER — Telehealth: Payer: Self-pay

## 2016-11-19 DIAGNOSIS — R195 Other fecal abnormalities: Secondary | ICD-10-CM

## 2016-11-19 NOTE — Telephone Encounter (Signed)
Patient advised of below , she is ok with referral.

## 2016-11-19 NOTE — Telephone Encounter (Signed)
Gastroenterology Pre-Procedure Review  Request Date: 12/06/16 Requesting Physician: Dr. Vicente Males  PATIENT REVIEW QUESTIONS: The patient responded to the following health history questions as indicated:    1. Are you having any GI issues? no 2. Do you have a personal history of Polyps? yes (greater than 5 years) 3. Do you have a family history of Colon Cancer or Polyps? yes (sister with colon cancer) 4. Diabetes Mellitus? no 5. Joint replacements in the past 12 months?no 6. Major health problems in the past 3 months?no 7. Any artificial heart valves, MVP, or defibrillator?no    MEDICATIONS & ALLERGIES:    Patient reports the following regarding taking any anticoagulation/antiplatelet therapy:   Plavix, Coumadin, Eliquis, Xarelto, Lovenox, Pradaxa, Brilinta, or Effient? no Aspirin? no  Patient confirms/reports the following medications:  Current Outpatient Prescriptions  Medication Sig Dispense Refill  . lisinopril (PRINIVIL,ZESTRIL) 10 MG tablet Take 1 tablet (10 mg total) by mouth daily. 90 tablet 0  . rosuvastatin (CRESTOR) 40 MG tablet Take 1 tablet (40 mg total) by mouth daily. 90 tablet 3  . Zoster Vaccine Live, PF, (ZOSTAVAX) 29562 UNT/0.65ML injection Inject 19,400 Units into the skin once. (Patient not taking: Reported on 10/26/2016) 1 each 0   No current facility-administered medications for this visit.     Patient confirms/reports the following allergies:  No Known Allergies  No orders of the defined types were placed in this encounter.   AUTHORIZATION INFORMATION Primary Insurance: 1D#: Group #:  Secondary Insurance: 1D#: Group #:  SCHEDULE INFORMATION: Date: 12/06/16 Time: Location:[facility]

## 2016-11-19 NOTE — Telephone Encounter (Signed)
-----   Message from [name], DO sent at 11/19/2016  7:24 AM EDT ----- Please inform of positive cologuard. Needs colonoscopy.  [place]

## 2016-11-20 ENCOUNTER — Encounter: Payer: Self-pay | Admitting: *Deleted

## 2016-11-20 ENCOUNTER — Other Ambulatory Visit: Payer: Self-pay

## 2016-11-20 DIAGNOSIS — R195 Other fecal abnormalities: Secondary | ICD-10-CM

## 2016-11-20 DIAGNOSIS — Z800 Family history of malignant neoplasm of digestive organs: Secondary | ICD-10-CM

## 2016-12-06 ENCOUNTER — Encounter
Admission: RE | Disposition: A | Discharge status: Home / Self Care | Payer: Self-pay | Source: Ambulatory Visit | Attending: Gastroenterology

## 2016-12-06 ENCOUNTER — Encounter: Payer: Self-pay | Admitting: *Deleted

## 2016-12-06 ENCOUNTER — Ambulatory Visit
Admission: RE | Admit: 2016-12-06 | Discharge: 2016-12-06 | Disposition: A | Discharge status: Home / Self Care | Payer: Medicare Other | Source: Ambulatory Visit | Attending: Gastroenterology | Admitting: Gastroenterology

## 2016-12-06 ENCOUNTER — Ambulatory Visit: Payer: Medicare Other | Admitting: Anesthesiology

## 2016-12-06 DIAGNOSIS — Z1211 Encounter for screening for malignant neoplasm of colon: Secondary | ICD-10-CM | POA: Diagnosis not present

## 2016-12-06 DIAGNOSIS — Z86010 Personal history of colonic polyps: Secondary | ICD-10-CM

## 2016-12-06 DIAGNOSIS — D120 Benign neoplasm of cecum: Secondary | ICD-10-CM

## 2016-12-06 DIAGNOSIS — K5730 Diverticulosis of large intestine without perforation or abscess without bleeding: Secondary | ICD-10-CM | POA: Insufficient documentation

## 2016-12-06 DIAGNOSIS — E785 Hyperlipidemia, unspecified: Secondary | ICD-10-CM | POA: Diagnosis not present

## 2016-12-06 DIAGNOSIS — Z87891 Personal history of nicotine dependence: Secondary | ICD-10-CM | POA: Diagnosis not present

## 2016-12-06 DIAGNOSIS — Z800 Family history of malignant neoplasm of digestive organs: Secondary | ICD-10-CM | POA: Insufficient documentation

## 2016-12-06 DIAGNOSIS — D123 Benign neoplasm of transverse colon: Secondary | ICD-10-CM | POA: Diagnosis not present

## 2016-12-06 DIAGNOSIS — Z79899 Other long term (current) drug therapy: Secondary | ICD-10-CM | POA: Insufficient documentation

## 2016-12-06 DIAGNOSIS — K640 First degree hemorrhoids: Secondary | ICD-10-CM | POA: Insufficient documentation

## 2016-12-06 DIAGNOSIS — M1990 Unspecified osteoarthritis, unspecified site: Secondary | ICD-10-CM | POA: Diagnosis not present

## 2016-12-06 DIAGNOSIS — D122 Benign neoplasm of ascending colon: Secondary | ICD-10-CM

## 2016-12-06 DIAGNOSIS — Z87442 Personal history of urinary calculi: Secondary | ICD-10-CM | POA: Diagnosis not present

## 2016-12-06 DIAGNOSIS — D124 Benign neoplasm of descending colon: Secondary | ICD-10-CM | POA: Diagnosis not present

## 2016-12-06 DIAGNOSIS — K635 Polyp of colon: Secondary | ICD-10-CM | POA: Diagnosis not present

## 2016-12-06 DIAGNOSIS — R195 Other fecal abnormalities: Secondary | ICD-10-CM

## 2016-12-06 DIAGNOSIS — I10 Essential (primary) hypertension: Secondary | ICD-10-CM | POA: Insufficient documentation

## 2016-12-06 HISTORY — PX: COLONOSCOPY WITH PROPOFOL: SHX5780

## 2016-12-06 SURGERY — COLONOSCOPY WITH PROPOFOL
Anesthesia: General

## 2016-12-06 MED ORDER — MIDAZOLAM HCL 2 MG/2ML IJ SOLN
INTRAMUSCULAR | Status: DC | PRN
  Administered 2016-12-06: 2 mg via INTRAVENOUS

## 2016-12-06 MED ORDER — PROPOFOL 500 MG/50ML IV EMUL
INTRAVENOUS | Status: AC
  Filled 2016-12-06: qty 50

## 2016-12-06 MED ORDER — EPHEDRINE SULFATE 50 MG/ML IJ SOLN
INTRAMUSCULAR | Status: DC | PRN
  Administered 2016-12-06: 5 mg via INTRAVENOUS
  Administered 2016-12-06: 10 mg via INTRAVENOUS

## 2016-12-06 MED ORDER — SODIUM CHLORIDE 0.9 % IV SOLN
INTRAVENOUS | Status: DC
  Administered 2016-12-06: 09:00:00 via INTRAVENOUS

## 2016-12-06 MED ORDER — FENTANYL CITRATE (PF) 100 MCG/2ML IJ SOLN
INTRAMUSCULAR | Status: DC | PRN
  Administered 2016-12-06: 25 ug via INTRAVENOUS
  Administered 2016-12-06: 50 ug via INTRAVENOUS
  Administered 2016-12-06: 25 ug via INTRAVENOUS

## 2016-12-06 MED ORDER — PROPOFOL 500 MG/50ML IV EMUL
INTRAVENOUS | Status: DC | PRN
  Administered 2016-12-06: 120 ug/kg/min via INTRAVENOUS

## 2016-12-06 MED ORDER — FENTANYL CITRATE (PF) 100 MCG/2ML IJ SOLN
INTRAMUSCULAR | Status: AC
  Filled 2016-12-06: qty 2

## 2016-12-06 MED ORDER — MIDAZOLAM HCL 2 MG/2ML IJ SOLN
INTRAMUSCULAR | Status: AC
  Filled 2016-12-06: qty 2

## 2016-12-06 NOTE — Op Note (Signed)
[facility] Gastroenterology Patient Name: Hannah Lee Procedure Date: 12/06/2016 9:56 AM MRN: [id number] Account #: [id number] Date of Birth: [birth year] Admit Type: Outpatient Age: 76 Room: [facility] ENDO ROOM 4 Gender: Female Note Status: Finalized Procedure:            Colonoscopy Indications:          High risk colon cancer surveillance: Personal history                        of colonic polyps Providers:            [name] MD, MD Referring MD:         Barnie Del. Lacinda Axon MD, MD (Referring MD) Medicines:            Monitored Anesthesia Care Complications:        No immediate complications. Procedure:            Pre-Anesthesia Assessment:                       - Prior to the procedure, a History and Physical was                        performed, and patient medications, allergies and                        sensitivities were reviewed. The patient's tolerance of                        previous anesthesia was reviewed.                       - The risks and benefits of the procedure and the                        sedation options and risks were discussed with the                        patient. All questions were answered and informed                        consent was obtained.                       - ASA Grade Assessment: III - A patient with severe                        systemic disease.                       - ASA Grade Assessment: III - A patient with severe                        systemic disease.                       After obtaining informed consent, the colonoscope was                        passed under direct vision. Throughout the procedure,  the patient's blood pressure, pulse, and oxygen                        saturations were monitored continuously. The                        Colonoscope was introduced through the anus and                        advanced to the the cecum, identified by the                        appendiceal  orifice, IC valve and transillumination.                        The colonoscopy was performed with ease. The patient                        tolerated the procedure well. The quality of the bowel                        preparation was good. Findings:      The perianal and digital rectal examinations were normal.      Non-bleeding internal hemorrhoids were found during retroflexion. The       hemorrhoids were small and Grade I (internal hemorrhoids that do not       prolapse).      Multiple small-mouthed diverticula were found in the sigmoid colon.      Two sessile polyps were found in the ascending colon. The polyps were 5       to 7 mm in size. These polyps were removed with a cold snare. Resection       and retrieval were complete.      A 5 mm polyp was found in the cecum. The polyp was sessile. The polyp       was removed with a cold snare. Resection and retrieval were complete.      A 5 mm polyp was found in the cecum. The polyp was sessile. The polyp       was removed with a cold biopsy forceps. Resection and retrieval were       complete.      A 10 mm polyp was found in the ascending colon. The polyp was sessile.       The polyp was removed with a hot snare. Resection and retrieval were       complete.      Two sessile polyps were found in the transverse colon. The polyps were 5       to 7 mm in size. These polyps were removed with a cold biopsy forceps.       Resection and retrieval were complete.      A 12 mm polyp was found in the descending colon. The polyp was       semi-pedunculated. The polyp was removed with a hot snare. Resection and       retrieval were complete. To prevent bleeding after the polypectomy, two       hemostatic clips were successfully placed. There was no bleeding during,       or at the end, of the procedure.      The exam was otherwise without abnormality on direct and retroflexion  views. Impression:           - Non-bleeding internal hemorrhoids.                        - Diverticulosis in the sigmoid colon.                       - Two 5 to 7 mm polyps in the ascending colon, removed                        with a cold snare. Resected and retrieved.                       - One 5 mm polyp in the cecum, removed with a cold                        snare. Resected and retrieved.                       - One 5 mm polyp in the cecum, removed with a cold                        biopsy forceps. Resected and retrieved.                       - One 10 mm polyp in the ascending colon, removed with                        a hot snare. Resected and retrieved.                       - Two 5 to 7 mm polyps in the transverse colon, removed                        with a cold biopsy forceps. Resected and retrieved.                       - One 12 mm polyp in the descending colon, removed with                        a hot snare. Resected and retrieved. Clips were placed.                       - The examination was otherwise normal on direct and                        retroflexion views. Recommendation:       - Discharge patient to home (with escort).                       - Resume regular diet.                       - Continue present medications.                       - No ibuprofen, naproxen, or other non-steroidal                        anti-inflammatory drugs  for 6 weeks after polyp removal. Procedure Code(s):    --- Professional ---                       [phone], Colonoscopy, flexible; with removal of tumor(s),                        polyp(s), or other lesion(s) by snare technique                       45380, 59, Colonoscopy, flexible; with biopsy, single                        or multiple Diagnosis Code(s):    --- Professional ---                       Z86.010, Personal history of colonic polyps                       K64.0, First degree hemorrhoids                       D12.2, Benign neoplasm of ascending colon                       D12.3, Benign neoplasm  of transverse colon (hepatic                        flexure or splenic flexure)                       D12.0, Benign neoplasm of cecum                       D12.4, Benign neoplasm of descending colon                       K57.30, Diverticulosis of large intestine without                        perforation or abscess without bleeding CPT copyright 2016 American Medical Association. All rights reserved. The codes documented in this report are preliminary and upon coder review may  be revised to meet current compliance requirements. [name], MD [name] MD, MD 12/06/2016 10:55:57 AM This report has been signed electronically. Number of Addenda: 0 Note Initiated On: 12/06/2016 9:56 AM Scope Withdrawal Time: 0 hours 32 minutes 27 seconds  Total Procedure Duration: 0 hours 39 minutes 5 seconds       [facility]

## 2016-12-06 NOTE — Anesthesia Post-op Follow-up Note (Cosign Needed)
Anesthesia QCDR form completed.        

## 2016-12-06 NOTE — H&P (Signed)
Hannah Bellows MD [address] [place], [place] [address] Phone: [phone] Fax : [phone]  Primary Care Physician:  [name], DO Primary Gastroenterologist:  Dr. Jonathon Lee   Pre-Procedure History & Physical: HPI:  Hannah Lee is a 76 y.o. female is here for an colonoscopy.   Past Medical History:  Diagnosis Date  . Allergy    Seasonal / Environmental - trees, pollen, dust  . Arthritis   . Chicken pox   . Colon polyp 2011   Colonoscopy  . Hyperlipidemia   . Hypertension   . Kidney stones    Told had through test    Past Surgical History:  Procedure Laterality Date  . TONSILLECTOMY AND ADENOIDECTOMY  1951    Prior to Admission medications   Medication Sig Start Date End Date Taking? Authorizing Provider  lisinopril (PRINIVIL,ZESTRIL) 10 MG tablet Take 1 tablet (10 mg total) by mouth daily. 10/26/16  Yes Cook, Jayce G, DO  rosuvastatin (CRESTOR) 40 MG tablet Take 1 tablet (40 mg total) by mouth daily. 05/17/16  Yes Cook, Jayce G, DO  Zoster Vaccine Live, PF, (ZOSTAVAX) 34742 UNT/0.65ML injection Inject 19,400 Units into the skin once. Patient not taking: Reported on 10/26/2016 11/23/15   [name], DO    Allergies as of 11/19/2016  . (No Known Allergies)    Family History  Problem Relation Age of Onset  . Hyperlipidemia Father   . Hypertension Father   . Stroke Father   . Cancer Sister        colon cancer  . Alzheimer's disease Sister   . Aortic aneurysm Brother 43       Ruptured Aneurysm  . Cancer Mother        Gall bladder  . Heart disease Paternal Grandmother   . Heart disease Paternal Grandfather   . Cancer Daughter 35       died of leukemia  . Aortic aneurysm Son     Social History   Social History  . Marital status: Married    Spouse name: Hannah Lee  . Number of children: 2  . Years of education: 12   Occupational History  . Retired     Theme park manager   Social History Main Topics  . Smoking status: Former Smoker    Years:  2.00    Quit date: 10/30/1963  . Smokeless tobacco: Never Used  . Alcohol use 3.5 oz/week    7 Standard drinks or equivalent per week     Comment: Wine - about a glass a day  . Drug use: No  . Sexual activity: No   Other Topics Concern  . Not on file   Social History Narrative   Born and raised in [place], [place]. Retirement brought down to [place] to escape the cold. Retired Theme park manager. Has recently moved, gardening, sewing. Working out every day. Walking, pilates, lifting weights at home. Married ([name]) with two children. Daughter died age 24 from leukemia. Son Hannah Lee) lives in [place]. They have a granddaughter Hannah Lee) who is attending [employer].    Review of Systems: See HPI, otherwise negative ROS  Physical Exam: BP (!) 176/95   Pulse 88   Temp 98.4 F (36.9 C) (Tympanic)   Resp 18   Ht 5\' 5"  (1.651 m)   Wt 176 lb (79.8 kg)   SpO2 98%   BMI 29.29 kg/m  General:   Alert,  pleasant and cooperative in NAD Head:  Normocephalic and atraumatic. Neck:  Supple; no masses or thyromegaly.  Lungs:  Clear throughout to auscultation.    Heart:  Regular rate and rhythm. Abdomen:  Soft, nontender and nondistended. Normal bowel sounds, without guarding, and without rebound.   Neurologic:  Alert and  oriented x4;  grossly normal neurologically.  Impression/Plan: Eldena Lee is here for an colonoscopy to be performed for surveillance due to prior history of colon polyps and family history of colon cancer  Risks, benefits, limitations, and alternatives regarding  colonoscopy have been reviewed with the patient.  Questions have been answered.  All parties agreeable.   Hannah Bellows, MD  12/06/2016, 9:16 AM

## 2016-12-06 NOTE — Transfer of Care (Signed)
Immediate Anesthesia Transfer of Care Note  Patient: Hannah Lee  Procedure(s) Performed: Procedure(s): COLONOSCOPY WITH PROPOFOL (N/A)  Patient Location: PACU  Anesthesia Type:General  Level of Consciousness: awake and sedated  Airway & Oxygen Therapy: Patient Spontanous Breathing and Patient connected to nasal cannula oxygen  Post-op Assessment: Report given to RN and Post -op Vital signs reviewed and stable  Post vital signs: Reviewed and stable  Last Vitals:  Vitals:   12/06/16 0841  BP: (!) 176/95  Pulse: 88  Resp: 18  Temp: 36.9 C    Last Pain:  Vitals:   12/06/16 0841  TempSrc: Tympanic         Complications: No apparent anesthesia complications

## 2016-12-06 NOTE — Anesthesia Preprocedure Evaluation (Signed)
Anesthesia Evaluation  Patient identified by MRN, date of birth, ID band Patient awake    Reviewed: Allergy & Precautions, H&P , NPO status , Patient's Chart, lab work & pertinent test results  History of Anesthesia Complications Negative for: history of anesthetic complications  Airway Mallampati: III  TM Distance: <3 FB Neck ROM: limited    Dental  (+) Chipped   Pulmonary neg shortness of breath, former smoker,           Cardiovascular Exercise Tolerance: Good hypertension, (-) angina(-) Past MI and (-) DOE      Neuro/Psych negative neurological ROS  negative psych ROS   GI/Hepatic negative GI ROS, Neg liver ROS,   Endo/Other  negative endocrine ROS  Renal/GU Renal disease  negative genitourinary   Musculoskeletal  (+) Arthritis ,   Abdominal   Peds  Hematology negative hematology ROS (+)   Anesthesia Other Findings Past Medical History: No date: Allergy     Comment:  Seasonal / Environmental - trees, pollen, dust No date: Arthritis No date: Chicken pox 2011: Colon polyp     Comment:  Colonoscopy No date: Hyperlipidemia No date: Hypertension No date: Kidney stones     Comment:  Told had through test  Past Surgical History: 1951: TONSILLECTOMY AND ADENOIDECTOMY  BMI    Body Mass Index:  29.29 kg/m      Reproductive/Obstetrics negative OB ROS                             Anesthesia Physical Anesthesia Plan  ASA: III  Anesthesia Plan: General   Post-op Pain Management:    Induction: Intravenous  PONV Risk Score and Plan:   Airway Management Planned: Natural Airway and Nasal Cannula  Additional Equipment:   Intra-op Plan:   Post-operative Plan:   Informed Consent: I have reviewed the patients History and Physical, chart, labs and discussed the procedure including the risks, benefits and alternatives for the proposed anesthesia with the patient or authorized  representative who has indicated his/her understanding and acceptance.   Dental Advisory Given  Plan Discussed with: Anesthesiologist, CRNA and Surgeon  Anesthesia Plan Comments: (Patient consented for risks of anesthesia including but not limited to:  - adverse reactions to medications - risk of intubation if required - damage to teeth, lips or other oral mucosa - sore throat or hoarseness - Damage to heart, brain, lungs or loss of life  Patient voiced understanding.)        Anesthesia Quick Evaluation

## 2016-12-06 NOTE — Anesthesia Procedure Notes (Signed)
Anesthesia Procedure Note     

## 2016-12-06 NOTE — Anesthesia Procedure Notes (Signed)
Performed by: COOK-MARTIN, Corinda Ammon Pre-anesthesia Checklist: Patient identified, Emergency Drugs available, Suction available, Patient being monitored and Timeout performed Patient Re-evaluated:Patient Re-evaluated prior to induction Oxygen Delivery Method: Nasal cannula Preoxygenation: Pre-oxygenation with 100% oxygen Induction Type: IV induction Placement Confirmation: positive ETCO2 and CO2 detector       

## 2016-12-07 ENCOUNTER — Encounter: Payer: Self-pay | Admitting: Gastroenterology

## 2016-12-07 LAB — SURGICAL PATHOLOGY

## 2016-12-07 NOTE — Anesthesia Postprocedure Evaluation (Signed)
Anesthesia Post Note  Patient: Hannah Lee  Procedure(s) Performed: Procedure(s) (LRB): COLONOSCOPY WITH PROPOFOL (N/A)  Patient location during evaluation: Endoscopy Anesthesia Type: General Level of consciousness: awake and alert Pain management: pain level controlled Vital Signs Assessment: post-procedure vital signs reviewed and stable Respiratory status: spontaneous breathing, nonlabored ventilation, respiratory function stable and patient connected to nasal cannula oxygen Cardiovascular status: blood pressure returned to baseline and stable Postop Assessment: no signs of nausea or vomiting Anesthetic complications: no     Last Vitals:  Vitals:   12/06/16 1115 12/06/16 1125  BP: 125/61 (!) 123/57  Pulse: 72 69  Resp: (!) 23 (!) 23  Temp:      Last Pain:  Vitals:   12/06/16 1055  TempSrc: Tympanic                 Hannah Lee

## 2016-12-09 ENCOUNTER — Encounter: Payer: Self-pay | Admitting: Gastroenterology

## 2017-01-07 ENCOUNTER — Other Ambulatory Visit: Payer: Self-pay | Admitting: Family Medicine

## 2017-01-07 DIAGNOSIS — I10 Essential (primary) hypertension: Secondary | ICD-10-CM

## 2017-03-05 ENCOUNTER — Other Ambulatory Visit: Payer: Self-pay | Admitting: Family Medicine

## 2017-07-13 ENCOUNTER — Other Ambulatory Visit: Payer: Self-pay | Admitting: Family Medicine

## 2017-07-13 DIAGNOSIS — I10 Essential (primary) hypertension: Secondary | ICD-10-CM

## 2017-07-15 NOTE — Telephone Encounter (Signed)
Pt has made transfer care appt with Arnett for 07/29/17.

## 2017-07-15 NOTE — Telephone Encounter (Signed)
Tried to reach patient by phone to establish with new provider either Arnett NP or Dr. Aundra Dubin Jacklynn Lewis so medication can be filled no answer left me message to call office.

## 2017-07-29 ENCOUNTER — Encounter: Payer: Self-pay | Admitting: Family

## 2017-07-29 ENCOUNTER — Ambulatory Visit (INDEPENDENT_AMBULATORY_CARE_PROVIDER_SITE_OTHER): Payer: Medicare Other | Admitting: Family

## 2017-07-29 ENCOUNTER — Other Ambulatory Visit: Payer: Self-pay

## 2017-07-29 VITALS — BP 150/90 | HR 81 | Temp 98.6°F | Resp 16 | Wt 183.5 lb

## 2017-07-29 DIAGNOSIS — Z1231 Encounter for screening mammogram for malignant neoplasm of breast: Secondary | ICD-10-CM

## 2017-07-29 DIAGNOSIS — E785 Hyperlipidemia, unspecified: Secondary | ICD-10-CM | POA: Diagnosis not present

## 2017-07-29 DIAGNOSIS — J309 Allergic rhinitis, unspecified: Secondary | ICD-10-CM | POA: Diagnosis not present

## 2017-07-29 DIAGNOSIS — Z1239 Encounter for other screening for malignant neoplasm of breast: Secondary | ICD-10-CM

## 2017-07-29 DIAGNOSIS — I10 Essential (primary) hypertension: Secondary | ICD-10-CM | POA: Diagnosis not present

## 2017-07-29 LAB — CBC WITH DIFFERENTIAL/PLATELET
BASOS PCT: 0.5 % (ref 0.0–3.0)
Basophils Absolute: 0 10*3/uL (ref 0.0–0.1)
EOS ABS: 0.2 10*3/uL (ref 0.0–0.7)
EOS PCT: 2.9 % (ref 0.0–5.0)
HEMATOCRIT: 39.4 % (ref 36.0–46.0)
HEMOGLOBIN: 13.5 g/dL (ref 12.0–15.0)
LYMPHS PCT: 25.3 % (ref 12.0–46.0)
Lymphs Abs: 1.9 10*3/uL (ref 0.7–4.0)
MCHC: 34.3 g/dL (ref 30.0–36.0)
MCV: 90.4 fl (ref 78.0–100.0)
MONOS PCT: 8.6 % (ref 3.0–12.0)
Monocytes Absolute: 0.7 10*3/uL (ref 0.1–1.0)
NEUTROS ABS: 4.8 10*3/uL (ref 1.4–7.7)
Neutrophils Relative %: 62.7 % (ref 43.0–77.0)
PLATELETS: 185 10*3/uL (ref 150.0–400.0)
RBC: 4.35 Mil/uL (ref 3.87–5.11)
RDW: 13.6 % (ref 11.5–15.5)
WBC: 7.6 10*3/uL (ref 4.0–10.5)

## 2017-07-29 LAB — LIPID PANEL
CHOLESTEROL: 196 mg/dL (ref 0–200)
HDL: 85 mg/dL (ref >39.00)
LDL CALC: 96 mg/dL (ref 0–99)
NonHDL: 110.57
Total CHOL/HDL Ratio: 2
Triglycerides: 72 mg/dL (ref 0.0–149.0)
VLDL: 14.4 mg/dL (ref 0.0–40.0)

## 2017-07-29 LAB — VITAMIN D 25 HYDROXY (VIT D DEFICIENCY, FRACTURES): VITD: 30.61 ng/mL (ref 30.00–100.00)

## 2017-07-29 LAB — COMPREHENSIVE METABOLIC PANEL
ALT: 17 U/L (ref 0–35)
AST: 18 U/L (ref 0–37)
Albumin: 4.8 g/dL (ref 3.5–5.2)
Alkaline Phosphatase: 82 U/L (ref 39–117)
BILIRUBIN TOTAL: 0.5 mg/dL (ref 0.2–1.2)
BUN: 17 mg/dL (ref 6–23)
CALCIUM: 10.2 mg/dL (ref 8.4–10.5)
CHLORIDE: 100 meq/L (ref 96–112)
CO2: 25 meq/L (ref 19–32)
CREATININE: 1.02 mg/dL (ref 0.40–1.20)
GFR: 56.15 mL/min — AB (ref >60.00)
Glucose, Bld: 111 mg/dL — ABNORMAL HIGH (ref 70–99)
Potassium: 4.1 mEq/L (ref 3.5–5.1)
Sodium: 137 mEq/L (ref 135–145)
Total Protein: 7.9 g/dL (ref 6.0–8.3)

## 2017-07-29 LAB — TSH: TSH: 2.08 u[IU]/mL (ref 0.35–4.50)

## 2017-07-29 LAB — HEMOGLOBIN A1C: HEMOGLOBIN A1C: 6.1 % (ref 4.6–6.5)

## 2017-07-29 MED ORDER — MONTELUKAST SODIUM 10 MG PO TABS: 10.0000 mg | ORAL_TABLET | Freq: Every day | ORAL | 2 refills | Status: DC

## 2017-07-29 MED ORDER — MONTELUKAST SODIUM 10 MG PO TABS: 10.0000 mg | ORAL_TABLET | Freq: Every day | ORAL | 3 refills | Status: DC

## 2017-07-29 NOTE — Assessment & Plan Note (Signed)
Chronic.  Discussed trial of Singulair.  Patient will let me know if helps her chronic postnasal drip.

## 2017-07-29 NOTE — Patient Instructions (Addendum)
Tetanus vaccine at local pharmacy  Monitor blood pressure,  Goal is less than 130/80; if persistently higher, please make sooner follow up appointment so we can recheck you blood pressure and manage medications  We placed a referral for mammogram this year. I asked that you call one the below locations and schedule this when it is convenient for you.   As discussed, I would like you to ask for 3D mammogram over the traditional 2D mammogram as new evidence suggest 3D is superior.   Please note that NOT all insurance companies cover 3D and you may have to pay a higher copay. You may call your insurance company to further clarify your benefits.   Options for [place]  [place], [place]  * [facility] [place] [facility] * Note if you ask for 3D mammogram at this location, you must request [place], [place] location*

## 2017-07-29 NOTE — Progress Notes (Signed)
Subjective:    Patient ID: Hannah Lee, female    DOB: 1940-07-17, 77 y.o.   MRN: [id number]  CC: Hannah Lee is a 77 y.o. female who presents today for follow up.   HPI: HTN- at home 140/50. Denies exertional chest pain or pressure, numbness or tingling radiating to left arm or jaw, palpitations, dizziness, frequent headaches, changes in vision, or shortness of breath.  Walks without CP.   Seasonal Allergies- on flonase, xyrtec which helps. Had been on allergy shots.   HLD- on crestor 60mg .     mammogram due Declines dexa today.  colonscopy UTD, due 2021  HISTORY:  Past Medical History:  Diagnosis Date  . Allergy    Seasonal / Environmental - trees, pollen, dust  . Arthritis   . Chicken pox   . Colon polyp 2011   Colonoscopy  . Hyperlipidemia   . Hypertension   . Kidney stones    Told had through test   Past Surgical History:  Procedure Laterality Date  . COLONOSCOPY WITH PROPOFOL N/A 12/06/2016   Procedure: COLONOSCOPY WITH PROPOFOL;  Surgeon: [name], MD;  [facility] ENDOSCOPY;  Service: Endoscopy;  Laterality: N/A;  . TONSILLECTOMY AND ADENOIDECTOMY  1951   Family History  Problem Relation Age of Onset  . Hyperlipidemia Father   . Hypertension Father   . Stroke Father   . Cancer Sister        colon cancer  . Alzheimer's disease Sister   . Aortic aneurysm Brother 43       Ruptured Aneurysm  . Cancer Mother        Gall bladder  . Heart disease Paternal Grandmother   . Heart disease Paternal Grandfather   . Cancer Daughter 75       died of leukemia  . Aortic aneurysm Son     Allergies: Influenza vaccines Current Outpatient Medications on File Prior to Visit  Medication Sig Dispense Refill  . lisinopril (PRINIVIL,ZESTRIL) 10 MG tablet TAKE 1 TABLET BY MOUTH  DAILY 90 tablet 1  . rosuvastatin (CRESTOR) 40 MG tablet TAKE 1 TABLET BY MOUTH  DAILY 90 tablet 1  . Zoster Vaccine Live, PF, (ZOSTAVAX) 62703 UNT/0.65ML injection Inject 19,400 Units  into the skin once. (Patient not taking: Reported on 10/26/2016) 1 each 0   No current facility-administered medications on file prior to visit.     Social History   Tobacco Use  . Smoking status: Former Smoker    Years: 2.00    Last attempt to quit: 10/30/1963    Years since quitting: 53.7  . Smokeless tobacco: Never Used  Substance Use Topics  . Alcohol use: Yes    Alcohol/week: 3.5 oz    Types: 7 Standard drinks or equivalent per week    Comment: Wine - about a glass a day  . Drug use: No    Review of Systems  Constitutional: Negative for chills and fever.  HENT: Positive for postnasal drip (chronic).   Respiratory: Negative for cough and shortness of breath.   Cardiovascular: Negative for chest pain and palpitations.  Gastrointestinal: Negative for nausea and vomiting.      Objective:    BP (!) 150/90   Pulse 81   Temp 98.6 F (37 C) (Oral)   Resp 16   Wt 183 lb 8 oz (83.2 kg)   SpO2 96%   BMI 30.54 kg/m  BP Readings from Last 3 Encounters:  07/29/17 (!) 150/90  12/06/16 (!) 123/57  10/26/16 120/80  Wt Readings from Last 3 Encounters:  07/29/17 183 lb 8 oz (83.2 kg)  12/06/16 176 lb (79.8 kg)  10/26/16 184 lb 12.8 oz (83.8 kg)    Physical Exam  Constitutional: She appears well-developed and well-nourished.  Eyes: Conjunctivae are normal.  Cardiovascular: Normal rate, regular rhythm, normal heart sounds and normal pulses.  Pulmonary/Chest: Effort normal and breath sounds normal. She has no wheezes. She has no rhonchi. She has no rales.  Neurological: She is alert.  Skin: Skin is warm and dry.  Psychiatric: She has a normal mood and affect. Her speech is normal and behavior is normal. Thought content normal.  Vitals reviewed.      Assessment & Plan:   Problem List Items Addressed This Visit      Cardiovascular and Mediastinum   Essential hypertension - Primary    Elevated today.  Patient would like to monitor blood pressure at home.  Will titrate  medications from there.  We discussed increasing lisinopril to 20 mg blood pressures if not at goal.  She understands to come back in about 5 days after that for lab work, pended BMP.      Relevant Orders   CBC with Differential/Platelet   Comprehensive metabolic panel   Hemoglobin A1c   Lipid panel   TSH   VITAMIN D 25 Hydroxy (Vit-D Deficiency, Fractures)   Basic metabolic panel     Respiratory   Allergic rhinitis    Chronic.  Discussed trial of Singulair.  Patient will let me know if helps her chronic postnasal drip.        Other   Hyperlipidemia    Compliant.  Pending lipid panel      Screening for breast cancer    Ordered.  Patient understands schedule          I am having Hannah Lee maintain her Zoster Vaccine Live (PF), rosuvastatin, and lisinopril.   Meds ordered this encounter  Medications  . DISCONTD: montelukast (SINGULAIR) 10 MG tablet    Sig: Take 1 tablet (10 mg total) by mouth at bedtime.    Dispense:  30 tablet    Refill:  3    Order Specific Question:   Supervising Provider    Answer:   Crecencio Mc [[id number]]    Return precautions given.   Risks, benefits, and alternatives of the medications and treatment plan prescribed today were discussed, and patient expressed understanding.   Education regarding symptom management and diagnosis given to patient on AVS.  Continue to follow with Burnard Hawthorne, FNP for routine health maintenance.   Hannah Lee and I agreed with plan.   Mable Paris, FNP

## 2017-07-29 NOTE — Telephone Encounter (Signed)
Refill request to [facility]

## 2017-07-29 NOTE — Assessment & Plan Note (Signed)
Elevated today.  Patient would like to monitor blood pressure at home.  Will titrate medications from there.  We discussed increasing lisinopril to 20 mg blood pressures if not at goal.  She understands to come back in about 5 days after that for lab work, pended BMP.

## 2017-07-29 NOTE — Assessment & Plan Note (Signed)
Compliant.  Pending lipid panel

## 2017-07-29 NOTE — Assessment & Plan Note (Signed)
Ordered.  Patient understands schedule

## 2017-10-11 ENCOUNTER — Ambulatory Visit
Admission: RE | Admit: 2017-10-11 | Discharge: 2017-10-11 | Disposition: A | Discharge status: Home / Self Care | Payer: Medicare Other | Source: Ambulatory Visit | Attending: Family | Admitting: Family

## 2017-10-11 DIAGNOSIS — Z1239 Encounter for other screening for malignant neoplasm of breast: Secondary | ICD-10-CM

## 2017-10-11 DIAGNOSIS — Z1231 Encounter for screening mammogram for malignant neoplasm of breast: Secondary | ICD-10-CM | POA: Insufficient documentation

## 2017-10-15 ENCOUNTER — Other Ambulatory Visit: Payer: Self-pay | Admitting: Family Medicine

## 2017-10-29 ENCOUNTER — Ambulatory Visit (INDEPENDENT_AMBULATORY_CARE_PROVIDER_SITE_OTHER): Payer: Medicare Other

## 2017-10-29 VITALS — BP 130/64 | HR 70 | Temp 98.1°F | Resp 14 | Ht 64.5 in | Wt 182.1 lb

## 2017-10-29 DIAGNOSIS — Z0000 Encounter for general adult medical examination without abnormal findings: Secondary | ICD-10-CM

## 2017-10-29 NOTE — Progress Notes (Signed)
Subjective:   Hannah Lee is a 77 y.o. female who presents for Medicare Annual (Subsequent) preventive examination.  Review of Systems:  No ROS.  Medicare Wellness Visit. Additional risk factors are reflected in the social history.  Cardiac Risk Factors include: advanced age (>54men, >70 women);hypertension;obesity (BMI >30kg/m2)     Objective:     Vitals: BP 130/64 (BP Location: Left Arm, Patient Position: Sitting, Cuff Size: Normal)   Pulse 70   Temp 98.1 F (36.7 C) (Oral)   Resp 14   Ht 5' 4.5" (1.638 m)   Wt 182 lb 1.9 oz (82.6 kg)   SpO2 96%   BMI 30.78 kg/m   Body mass index is 30.78 kg/m.  Advanced Directives 10/29/2017 12/06/2016 10/26/2016  Does Patient Have a Medical Advance Directive? Yes Yes Yes  Type of Paramedic of [place];Living will Healthcare Power of [place];Living will  Does patient want to make changes to medical advance directive? No - Patient declined - No - Patient declined  Copy of [place] in Chart? No - copy requested No - copy requested No - copy requested    Tobacco Social History   Tobacco Use  Smoking Status Former Smoker  . Years: 2.00  . Last attempt to quit: 10/30/1963  . Years since quitting: 54.0  Smokeless Tobacco Never Used     Counseling given: Not Answered   Clinical Intake:  Pre-visit preparation completed: Yes  Pain : No/denies pain     Nutritional Status: BMI > 30  Obese Diabetes: No  How often do you need to have someone help you when you read instructions, pamphlets, or other written materials from your doctor or pharmacy?: 1 - Never  Interpreter Needed?: No     Past Medical History:  Diagnosis Date  . Allergy    Seasonal / Environmental - trees, pollen, dust  . Arthritis   . Chicken pox   . Colon polyp 2011   Colonoscopy  . Hyperlipidemia   . Hypertension   . Kidney stones    Told had through test   Past Surgical  History:  Procedure Laterality Date  . COLONOSCOPY WITH PROPOFOL N/A 12/06/2016   Procedure: COLONOSCOPY WITH PROPOFOL;  Surgeon: Hannah Bellows, MD;  Location: [facility] ENDOSCOPY;  Service: Endoscopy;  Laterality: N/A;  . TONSILLECTOMY AND ADENOIDECTOMY  1951   Family History  Problem Relation Age of Onset  . Hyperlipidemia Father   . Hypertension Father   . Stroke Father   . Cancer Sister        colon cancer  . Alzheimer's disease Sister   . Aortic aneurysm Brother 43       Ruptured Aneurysm  . Cancer Mother        Gall bladder  . Heart disease Paternal Grandmother   . Heart disease Paternal Grandfather   . Cancer Daughter 65       died of leukemia  . Aortic aneurysm Son    Social History   Socioeconomic History  . Marital status: Married    Spouse name: Hannah Lee  . Number of children: 2  . Years of education: 68  . Highest education level: Not on file  Occupational History  . Occupation: Retired    Comment: Photographer  . Financial resource strain: Not hard at all  . Food insecurity:    Worry: Never true    Inability: Never true  . Transportation needs:    Medical: No  Non-medical: No  Tobacco Use  . Smoking status: Former Smoker    Years: 2.00    Last attempt to quit: 10/30/1963    Years since quitting: 54.0  . Smokeless tobacco: Never Used  Substance and Sexual Activity  . Alcohol use: Yes    Alcohol/week: 4.2 oz    Types: 7 Standard drinks or equivalent per week    Comment: Wine - about a glass a day  . Drug use: No  . Sexual activity: Never  Lifestyle  . Physical activity:    Days per week: 2 days    Minutes per session: 30 min  . Stress: Not at all  Relationships  . Social connections:    Talks on phone: Not on file    Gets together: Not on file    Attends religious service: Not on file    Active member of club or organization: Not on file    Attends meetings of clubs or organizations: Not on file    Relationship status: Not on file    Other Topics Concern  . Not on file  Social History Narrative   Born and raised in [place], [place]. Retirement brought down to [place] to escape the cold. Retired Theme park manager. Has recently moved, gardening, sewing. Working out every day. Walking, pilates, lifting weights at home. Married (Hannah Lee) with two children. Daughter died age 1 from leukemia. Son Hannah Lee) lives in [place]. They have a granddaughter Hannah Lee) who is attending [employer].    Outpatient Encounter Medications as of 10/29/2017  Medication Sig  . lisinopril (PRINIVIL,ZESTRIL) 10 MG tablet TAKE 1 TABLET BY MOUTH  DAILY  . montelukast (SINGULAIR) 10 MG tablet Take 1 tablet (10 mg total) by mouth at bedtime.  . rosuvastatin (CRESTOR) 20 MG tablet Take 20 mg by mouth daily.  . rosuvastatin (CRESTOR) 40 MG tablet TAKE 1 TABLET BY MOUTH  DAILY  . Zoster Vaccine Live, PF, (ZOSTAVAX) 84696 UNT/0.65ML injection Inject 19,400 Units into the skin once.   No facility-administered encounter medications on file as of 10/29/2017.     Activities of Daily Living In your present state of health, do you have any difficulty performing the following activities: 10/29/2017  Hearing? N  Vision? N  Difficulty concentrating or making decisions? N  Walking or climbing stairs? N  Dressing or bathing? N  Doing errands, shopping? N  Preparing Food and eating ? N  Using the Toilet? N  In the past six months, have you accidently leaked urine? N  Do you have problems with loss of bowel control? N  Managing your Medications? N  Managing your Finances? N  Housekeeping or managing your Housekeeping? N  Some recent data might be hidden    Patient Care Team: Hannah Hawthorne, FNP as PCP - General (Family Medicine)    Assessment:   This is a routine wellness examination for [name].  The goal of the wellness visit is to assist the patient how to close the gaps in care and create a preventative care plan for the patient.   The roster of all  physicians providing medical care to patient is listed in the Snapshot section of the chart.  Osteoporosis risk reviewed.    Safety issues reviewed; Smoke and carbon monoxide detectors in the home. No firearms in the home. Wears seatbelts when driving or riding with others. No violence in the home.  They do not have excessive sun exposure.  Discussed the need for sun protection: hats, long sleeves and the use of  sunscreen if there is significant sun exposure.  Patient is alert, normal appearance, oriented to person/place/and time.  Correctly identified the president of the [name] and recalls of 3/3 words. Performs simple calculations and can read correct time from watch face.  Displays appropriate judgement.  No new identified risk were noted.  No failures at ADL's or IADL's.    BMI- discussed the importance of a healthy diet, water intake and the benefits of aerobic exercise. Educational material provided.   24 hour diet recall: Low carb diet  Dental- every 6 months.  Eye- Visual acuity not assessed per patient preference since they have regular follow up with the ophthalmologist.  Wears corrective lenses.  Sleep patterns- Sleeps 6 hours at night.  Wakes feeling rested.   TDAP vaccine deferred per patient preference.  Follow up with insurance.  Educational material provided.  Reports blood pressures consistent below 140/90 at home.  She is not taking increased dose of lisinopril.  BMP order pending.   Medications-reports taking Crestor 60mg  daily.  Additional 20mg  added to medication list.   Patient Concerns: None at this time. Follow up with PCP as needed.  Exercise Activities and Dietary recommendations Current Exercise Habits: Home exercise routine, Type of exercise: walking, Time (Minutes): 30, Frequency (Times/Week): 2, Weekly Exercise (Minutes/Week): 60, Intensity: Moderate  Goals    . Maintain Healthy Lifestyle     Stay hydrated Exercise Healthy diet        Fall Risk Fall Risk  10/29/2017 10/26/2016 10/29/2013  Falls in the past year? No No No  Depression Screen PHQ 2/9 Scores 10/29/2017 10/26/2016 10/29/2013  PHQ - 2 Score 0 0 0     Cognitive Function MMSE - Mini Mental State Exam 10/29/2017 10/26/2016  Orientation to time 5 5  Orientation to Place 5 5  Registration 3 3  Attention/ Calculation 5 5  Recall 3 3  Language- name 2 objects 2 2  Language- repeat 1 1  Language- follow 3 step command 3 3  Language- read & follow direction 1 1  Write a sentence 1 1  Copy design 1 1  Total score 30 30        Immunization History  Administered Date(s) Administered  . Influenza, High Dose Seasonal PF 07/05/2016  . Influenza,inj,Quad PF,6+ Mos 03/25/2014  . Pneumococcal Conjugate-13 10/29/2013  . Pneumococcal Polysaccharide-23 11/23/2015   Screening Tests Health Maintenance  Topic Date Due  . TETANUS/TDAP  08/04/1961  . INFLUENZA VACCINE  12/12/2017  . COLONOSCOPY  12/07/2019  . DEXA SCAN  Completed  . PNA vac Low Risk Adult  Completed      Plan:    End of life planning; Advance aging; Advanced directives discussed. Copy of current HCPOA/Living Will requested.    I have personally reviewed and noted the following in the patient's chart:   . Medical and social history . Use of alcohol, tobacco or illicit drugs  . Current medications and supplements . Functional ability and status . Nutritional status . Physical activity . Advanced directives . List of other physicians . Hospitalizations, surgeries, and ER visits in previous 12 months . Vitals . Screenings to include cognitive, depression, and falls . Referrals and appointments  In addition, I have reviewed and discussed with patient certain preventive protocols, quality metrics, and best practice recommendations. A written personalized care plan for preventive services as well as general preventive health recommendations were provided to patient.     Varney Biles, LPN  07/27/4006

## 2017-10-29 NOTE — Patient Instructions (Addendum)
  Ms. Hannah Lee , Thank you for taking time to come for your Medicare Wellness Visit. I appreciate your ongoing commitment to your health goals. Please review the following plan we discussed and let me know if I can assist you in the future.   These are the goals we discussed: Goals    . Maintain Healthy Lifestyle     Stay hydrated Exercise Healthy diet       This is a list of the screening recommended for you and due dates:  Health Maintenance  Topic Date Due  . Tetanus Vaccine  08/04/1961  . Flu Shot  12/12/2017  . Colon Cancer Screening  12/07/2019  . DEXA scan (bone density measurement)  Completed  . Pneumonia vaccines  Completed

## 2017-11-18 ENCOUNTER — Other Ambulatory Visit: Payer: Self-pay

## 2017-11-18 DIAGNOSIS — I10 Essential (primary) hypertension: Secondary | ICD-10-CM

## 2017-11-18 DIAGNOSIS — J309 Allergic rhinitis, unspecified: Secondary | ICD-10-CM

## 2017-11-18 MED ORDER — MONTELUKAST SODIUM 10 MG PO TABS: 10.0000 mg | ORAL_TABLET | Freq: Every day | ORAL | 1 refills | Status: DC

## 2017-12-21 ENCOUNTER — Other Ambulatory Visit: Payer: Self-pay | Admitting: Family

## 2017-12-21 DIAGNOSIS — I10 Essential (primary) hypertension: Secondary | ICD-10-CM

## 2018-03-30 ENCOUNTER — Other Ambulatory Visit: Payer: Self-pay | Admitting: Family Medicine

## 2018-04-12 DIAGNOSIS — J019 Acute sinusitis, unspecified: Secondary | ICD-10-CM | POA: Diagnosis not present

## 2018-04-28 ENCOUNTER — Other Ambulatory Visit: Payer: Self-pay | Admitting: Family

## 2018-04-28 DIAGNOSIS — J309 Allergic rhinitis, unspecified: Secondary | ICD-10-CM

## 2018-04-28 DIAGNOSIS — I10 Essential (primary) hypertension: Secondary | ICD-10-CM

## 2018-06-08 ENCOUNTER — Other Ambulatory Visit: Payer: Self-pay | Admitting: Family

## 2018-06-08 DIAGNOSIS — J309 Allergic rhinitis, unspecified: Secondary | ICD-10-CM

## 2018-06-08 DIAGNOSIS — I10 Essential (primary) hypertension: Secondary | ICD-10-CM

## 2018-09-14 ENCOUNTER — Other Ambulatory Visit: Payer: Self-pay | Admitting: Family

## 2018-11-05 ENCOUNTER — Ambulatory Visit: Payer: Medicare Other

## 2018-11-05 ENCOUNTER — Other Ambulatory Visit: Payer: Self-pay

## 2018-11-05 ENCOUNTER — Encounter: Payer: Self-pay | Admitting: Family

## 2018-11-05 ENCOUNTER — Ambulatory Visit (INDEPENDENT_AMBULATORY_CARE_PROVIDER_SITE_OTHER): Payer: Medicare Other | Admitting: Family

## 2018-11-05 ENCOUNTER — Telehealth: Payer: Self-pay

## 2018-11-05 DIAGNOSIS — Z1239 Encounter for other screening for malignant neoplasm of breast: Secondary | ICD-10-CM

## 2018-11-05 DIAGNOSIS — E785 Hyperlipidemia, unspecified: Secondary | ICD-10-CM | POA: Diagnosis not present

## 2018-11-05 DIAGNOSIS — I10 Essential (primary) hypertension: Secondary | ICD-10-CM

## 2018-11-05 DIAGNOSIS — Z13820 Encounter for screening for osteoporosis: Secondary | ICD-10-CM

## 2018-11-05 MED ORDER — ROSUVASTATIN CALCIUM 20 MG PO TABS: ORAL_TABLET | ORAL | 3 refills | Status: DC

## 2018-11-05 NOTE — Assessment & Plan Note (Signed)
Slightly above goal.  Patient politely declines increasing lisinopril at this time.  Long discussion regards to blood pressure goal and advised new BP goal being less than 120/80 to reduce risk of stroke and heart attack.  However patient  more comfortable reaching for goal of 130/80.  She will call me with blood pressure readings.  Also encouraged weight loss, low-salt.

## 2018-11-05 NOTE — Telephone Encounter (Signed)
Pt called back and didn't want to schedule AWV at this time

## 2018-11-05 NOTE — Patient Instructions (Addendum)
Please send blood pressure readings.  Monitor blood pressure,  Goal is less than 120/80, based on newest guidelines; if persistently higher, please make sooner follow up appointment so we can recheck you blood pressure and manage medications  We will confirm colonoscopy repeat, please call the office if you do not hear back in regards to this.   Please take Crestor 40mg - this is  Maximum dose. DO NOT TAKE 60 mg dose as this is TOO high  Fasting labs  Tetanus or Tdap vaccine due  Please call call and schedule your 3D mammogram, bone density scan as discussed.   [place]  [place], [place]   Stay safe!

## 2018-11-05 NOTE — Telephone Encounter (Signed)
Patient was called for scheduled annual wellness visit. No answer. Unable to leave a voice mail. OK to reschedule patient for a telephone visit if she calls the office.

## 2018-11-05 NOTE — Assessment & Plan Note (Signed)
Patient will schedule bone density and mammogram. Ordered.

## 2018-11-05 NOTE — Progress Notes (Signed)
Printed and mailed AVS.

## 2018-11-05 NOTE — Progress Notes (Signed)
LMTCB to schedule fasting labs °

## 2018-11-05 NOTE — Progress Notes (Signed)
Verbal consent for services obtained from patient prior to services given.  Location of call:  provider at work patient at home  Names of all persons present for services: Mable Paris, NP Chief complaint:   Feels well today. No complaints.   HTN- compliant with lisinopril. At 140/80, HR 88. Denies exertional chest pain or pressure, numbness or tingling radiating to left arm or jaw, palpitations, dizziness, frequent headaches, changes in vision, or shortness of breath.   HLD - on crestor 60mg .   History, background, results pertinent:   Due tdap, dexa, mammogram  utd colonoscopy; repeat 3 years. Unsure if she was to come back.  Family h/o colon cancer.   A/P/next steps:  Problem List Items Addressed This Visit      Cardiovascular and Mediastinum   Essential hypertension    Slightly above goal.  Patient politely declines increasing lisinopril at this time.  Long discussion regards to blood pressure goal and advised new BP goal being less than 120/80 to reduce risk of stroke and heart attack.  However patient  more comfortable reaching for goal of 130/80.  She will call me with blood pressure readings.  Also encouraged weight loss, low-salt.        Other   Hyperlipidemia    Patient had been on Crestor 60 mg.  I reviewed her chart with her over the phone today and we cannot figure out how this happened.  Suspect miscommunication which I apologized for.  Advised her she should only be on 40 mg which is the max dose of Crestor.  She will start taking 40 mg immediately.  Pending lipid panel, CMP      Relevant Orders   Comprehensive metabolic panel   Hemoglobin A1c   Lipid panel   Screening for breast cancer    Patient will schedule bone density and mammogram. Ordered.       Relevant Orders   MM 3D SCREEN BREAST BILATERAL    Other Visit Diagnoses    Screening for osteoporosis    -  Primary   Relevant Orders   DG Bone Density      I spent 25 min  discussing plan of  care over the phone.

## 2018-11-05 NOTE — Assessment & Plan Note (Signed)
Patient had been on Crestor 60 mg.  I reviewed her chart with her over the phone today and we cannot figure out how this happened.  Suspect miscommunication which I apologized for.  Advised her she should only be on 40 mg which is the max dose of Crestor.  She will start taking 40 mg immediately.  Pending lipid panel, CMP

## 2018-11-06 ENCOUNTER — Other Ambulatory Visit (INDEPENDENT_AMBULATORY_CARE_PROVIDER_SITE_OTHER): Payer: Medicare Other

## 2018-11-06 ENCOUNTER — Other Ambulatory Visit: Payer: Self-pay

## 2018-11-06 DIAGNOSIS — E785 Hyperlipidemia, unspecified: Secondary | ICD-10-CM | POA: Diagnosis not present

## 2018-11-06 LAB — COMPREHENSIVE METABOLIC PANEL
ALT: 17 U/L (ref 0–35)
AST: 16 U/L (ref 0–37)
Albumin: 4.6 g/dL (ref 3.5–5.2)
Alkaline Phosphatase: 49 U/L (ref 39–117)
BUN: 26 mg/dL — ABNORMAL HIGH (ref 6–23)
CO2: 28 mEq/L (ref 19–32)
Calcium: 9.7 mg/dL (ref 8.4–10.5)
Chloride: 103 mEq/L (ref 96–112)
Creatinine, Ser: 1.53 mg/dL — ABNORMAL HIGH (ref 0.40–1.20)
GFR: 32.98 mL/min — ABNORMAL LOW (ref >60.00)
Glucose, Bld: 100 mg/dL — ABNORMAL HIGH (ref 70–99)
Potassium: 4.1 mEq/L (ref 3.5–5.1)
Sodium: 140 mEq/L (ref 135–145)
Total Bilirubin: 0.5 mg/dL (ref 0.2–1.2)
Total Protein: 6.9 g/dL (ref 6.0–8.3)

## 2018-11-06 LAB — LIPID PANEL
Cholesterol: 193 mg/dL (ref 0–200)
HDL: 79.3 mg/dL (ref >39.00)
LDL Cholesterol: 100 mg/dL — ABNORMAL HIGH (ref 0–99)
NonHDL: 113.5
Total CHOL/HDL Ratio: 2
Triglycerides: 70 mg/dL (ref 0.0–149.0)
VLDL: 14 mg/dL (ref 0.0–40.0)

## 2018-11-06 LAB — HEMOGLOBIN A1C: Hgb A1c MFr Bld: 6.3 % (ref 4.6–6.5)

## 2018-11-10 NOTE — Progress Notes (Signed)
Patient has already spoke to someone & scheduled labs 6/25.

## 2018-11-10 NOTE — Progress Notes (Signed)
Patient is due 11/2019. She is also on their recall list.

## 2018-11-11 ENCOUNTER — Other Ambulatory Visit: Payer: Self-pay | Admitting: Family

## 2018-11-11 DIAGNOSIS — I10 Essential (primary) hypertension: Secondary | ICD-10-CM

## 2018-11-12 ENCOUNTER — Other Ambulatory Visit: Payer: Self-pay | Admitting: Family

## 2018-11-12 DIAGNOSIS — R899 Unspecified abnormal finding in specimens from other organs, systems and tissues: Secondary | ICD-10-CM

## 2018-11-17 DIAGNOSIS — L820 Inflamed seborrheic keratosis: Secondary | ICD-10-CM | POA: Diagnosis not present

## 2018-11-17 DIAGNOSIS — L570 Actinic keratosis: Secondary | ICD-10-CM | POA: Diagnosis not present

## 2018-11-17 DIAGNOSIS — L308 Other specified dermatitis: Secondary | ICD-10-CM | POA: Diagnosis not present

## 2018-11-18 ENCOUNTER — Encounter: Payer: Self-pay | Admitting: Family

## 2018-11-26 ENCOUNTER — Other Ambulatory Visit (INDEPENDENT_AMBULATORY_CARE_PROVIDER_SITE_OTHER): Payer: Medicare Other

## 2018-11-26 ENCOUNTER — Other Ambulatory Visit: Payer: Self-pay

## 2018-11-26 DIAGNOSIS — R899 Unspecified abnormal finding in specimens from other organs, systems and tissues: Secondary | ICD-10-CM

## 2018-11-26 LAB — BASIC METABOLIC PANEL
BUN: 25 mg/dL — ABNORMAL HIGH (ref 6–23)
CO2: 24 mEq/L (ref 19–32)
Calcium: 9.5 mg/dL (ref 8.4–10.5)
Chloride: 101 mEq/L (ref 96–112)
Creatinine, Ser: 1.13 mg/dL (ref 0.40–1.20)
GFR: 46.78 mL/min — ABNORMAL LOW (ref >60.00)
Glucose, Bld: 94 mg/dL (ref 70–99)
Potassium: 4.4 mEq/L (ref 3.5–5.1)
Sodium: 136 mEq/L (ref 135–145)

## 2018-12-01 ENCOUNTER — Other Ambulatory Visit: Payer: Self-pay | Admitting: Family

## 2018-12-01 DIAGNOSIS — I10 Essential (primary) hypertension: Secondary | ICD-10-CM

## 2018-12-30 ENCOUNTER — Other Ambulatory Visit (INDEPENDENT_AMBULATORY_CARE_PROVIDER_SITE_OTHER): Payer: Medicare Other

## 2018-12-30 ENCOUNTER — Other Ambulatory Visit: Payer: Self-pay

## 2018-12-30 DIAGNOSIS — I10 Essential (primary) hypertension: Secondary | ICD-10-CM

## 2018-12-30 LAB — BASIC METABOLIC PANEL
BUN: 26 mg/dL — ABNORMAL HIGH (ref 6–23)
CO2: 25 mEq/L (ref 19–32)
Calcium: 9.3 mg/dL (ref 8.4–10.5)
Chloride: 105 mEq/L (ref 96–112)
Creatinine, Ser: 1.17 mg/dL (ref 0.40–1.20)
GFR: 44.92 mL/min — ABNORMAL LOW (ref >60.00)
Glucose, Bld: 100 mg/dL — ABNORMAL HIGH (ref 70–99)
Potassium: 4.2 mEq/L (ref 3.5–5.1)
Sodium: 138 mEq/L (ref 135–145)

## 2018-12-31 ENCOUNTER — Other Ambulatory Visit: Payer: Self-pay | Admitting: Family

## 2018-12-31 DIAGNOSIS — N289 Disorder of kidney and ureter, unspecified: Secondary | ICD-10-CM

## 2019-01-13 ENCOUNTER — Other Ambulatory Visit: Payer: Self-pay | Admitting: Family

## 2019-01-30 ENCOUNTER — Other Ambulatory Visit: Payer: Self-pay

## 2019-01-30 ENCOUNTER — Ambulatory Visit (INDEPENDENT_AMBULATORY_CARE_PROVIDER_SITE_OTHER): Payer: Medicare Other | Admitting: Family Medicine

## 2019-01-30 ENCOUNTER — Encounter: Payer: Self-pay | Admitting: Family Medicine

## 2019-01-30 VITALS — BP 148/62 | Temp 95.7°F | Resp 16 | Ht 64.5 in | Wt 179.4 lb

## 2019-01-30 DIAGNOSIS — N289 Disorder of kidney and ureter, unspecified: Secondary | ICD-10-CM

## 2019-01-30 DIAGNOSIS — E785 Hyperlipidemia, unspecified: Secondary | ICD-10-CM

## 2019-01-30 DIAGNOSIS — I10 Essential (primary) hypertension: Secondary | ICD-10-CM | POA: Diagnosis not present

## 2019-01-30 DIAGNOSIS — Z23 Encounter for immunization: Secondary | ICD-10-CM | POA: Diagnosis not present

## 2019-01-30 LAB — BASIC METABOLIC PANEL
BUN: 15 mg/dL (ref 6–23)
CO2: 28 mEq/L (ref 19–32)
Calcium: 10 mg/dL (ref 8.4–10.5)
Chloride: 101 mEq/L (ref 96–112)
Creatinine, Ser: 1.01 mg/dL (ref 0.40–1.20)
GFR: 53.22 mL/min — ABNORMAL LOW (ref >60.00)
Glucose, Bld: 105 mg/dL — ABNORMAL HIGH (ref 70–99)
Potassium: 4.5 mEq/L (ref 3.5–5.1)
Sodium: 137 mEq/L (ref 135–145)

## 2019-01-30 NOTE — Patient Instructions (Signed)
We can consider changing BP med to AMLODIPINE from lisinopril due to kidney functions  We will wait to see new labs and after appt with nephrology

## 2019-01-30 NOTE — Progress Notes (Signed)
Subjective:    Patient ID: Hannah Lee, female    DOB: 1940-11-13, 78 y.o.   MRN: [id number]  HPI   Patient presents to clinic for follow-up on decreased kidney function, blood pressure and hyperlipidemia.  She does have nephrology appointment scheduled for early next week.  Currently is on lisinopril 10 mg for BP control.  BPs have been slightly above low recently, but she has not wanted to increase her medication.  Tolerating statin without any adverse side effects.  BMP Latest Ref Rng & Units 12/30/2018 11/26/2018 11/06/2018  Glucose 70 - 99 mg/dL 100(H) 94 100(H)  BUN 6 - 23 mg/dL 26(H) 25(H) 26(H)  Creatinine 0.40 - 1.20 mg/dL 1.17 1.13 1.53(H)  Sodium 135 - 145 mEq/L 138 136 140  Potassium 3.5 - 5.1 mEq/L 4.2 4.4 4.1  Chloride 96 - 112 mEq/L 105 101 103  CO2 19 - 32 mEq/L 25 24 28   Calcium 8.4 - 10.5 mg/dL 9.3 9.5 9.7   Patient Active Problem List   Diagnosis Date Noted  . Allergic rhinitis 07/29/2017  . Screening for breast cancer 07/29/2017  . Encounter for preventative adult health care exam with abnormal findings 11/23/2015  . Hyperlipidemia 10/31/2013  . Essential hypertension 10/31/2013   Social History   Tobacco Use  . Smoking status: Former Smoker    Years: 2.00    Quit date: 10/30/1963    Years since quitting: 55.2  . Smokeless tobacco: Never Used  Substance Use Topics  . Alcohol use: Yes    Alcohol/week: 7.0 standard drinks    Types: 7 Standard drinks or equivalent per week    Comment: Wine - about a glass a day    Review of Systems  Constitutional: Negative for chills, fatigue and fever.  HENT: Negative for congestion, ear pain, sinus pain and sore throat.   Eyes: Negative.   Respiratory: Negative for cough, shortness of breath and wheezing.   Cardiovascular: Negative for chest pain, palpitations and leg swelling.  Gastrointestinal: Negative for abdominal pain, diarrhea, nausea and vomiting.  Genitourinary: Negative for dysuria, frequency and  urgency.  Musculoskeletal: Negative for arthralgias and myalgias.  Skin: Negative for color change, pallor and rash.  Neurological: Negative for syncope, light-headedness and headaches.  Psychiatric/Behavioral: The patient is not nervous/anxious.       Objective:   Physical Exam Vitals signs and nursing note reviewed.  Constitutional:      General: She is not in acute distress.    Appearance: She is not ill-appearing, toxic-appearing or diaphoretic.  HENT:     Head: Normocephalic and atraumatic.  Eyes:     General: No scleral icterus.    Extraocular Movements: Extraocular movements intact.     Conjunctiva/sclera: Conjunctivae normal.     Pupils: Pupils are equal, round, and reactive to light.  Neck:     Musculoskeletal: Normal range of motion and neck supple. No neck rigidity.     Vascular: No carotid bruit.  Cardiovascular:     Rate and Rhythm: Normal rate and regular rhythm.     Heart sounds: Normal heart sounds.  Pulmonary:     Effort: Pulmonary effort is normal. No respiratory distress.     Breath sounds: Normal breath sounds.  Abdominal:     General: Bowel sounds are normal. There is no distension.     Palpations: Abdomen is soft.     Tenderness: There is no abdominal tenderness. There is no right CVA tenderness, left CVA tenderness, guarding or rebound.  Hernia: No hernia is present.  Musculoskeletal:     Right lower leg: No edema.     Left lower leg: No edema.  Skin:    General: Skin is warm and dry.     Coloration: Skin is not jaundiced or pale.  Neurological:     General: No focal deficit present.     Mental Status: She is alert and oriented to person, place, and time.     Gait: Gait normal.  Psychiatric:        Mood and Affect: Mood normal.        Behavior: Behavior normal.        Thought Content: Thought content normal.        Judgment: Judgment normal.     Today's Vitals   01/30/19 0809  BP: (!) 148/62  Resp: 16  Temp: (!) 95.7 F (35.4 C)   TempSrc: Temporal  Weight: 179 lb 6.4 oz (81.4 kg)  Height: 5' 4.5" (1.638 m)   Body mass index is 30.32 kg/m.      Assessment & Plan:    1. Decreased renal function Will recheck BMP to follow up on kidney levels  - Basic metabolic panel  2. Essential hypertension BP still slightly above goal. Discussed idea of changing lisinopril to amlodipine. Will wait until after nephrology appt next week  - Basic metabolic panel  3. Hyperlipidemia, unspecified hyperlipidemia type Tolerating statin without any issues  4. Need for immunization against influenza  - Flu Vaccine QUAD 36+ mos IM   We will plan to follow up regularly in 3 months for recheck. Will follow up sooner if needed/pending recommendations by nephrology.

## 2019-02-02 DIAGNOSIS — I10 Essential (primary) hypertension: Secondary | ICD-10-CM | POA: Diagnosis not present

## 2019-02-02 DIAGNOSIS — R809 Proteinuria, unspecified: Secondary | ICD-10-CM | POA: Diagnosis not present

## 2019-02-02 DIAGNOSIS — N183 Chronic kidney disease, stage 3 (moderate): Secondary | ICD-10-CM | POA: Diagnosis not present

## 2019-02-03 ENCOUNTER — Other Ambulatory Visit (HOSPITAL_COMMUNITY): Payer: Self-pay | Admitting: Nephrology

## 2019-02-03 ENCOUNTER — Other Ambulatory Visit: Payer: Self-pay | Admitting: Nephrology

## 2019-02-03 DIAGNOSIS — N189 Chronic kidney disease, unspecified: Secondary | ICD-10-CM

## 2019-02-09 ENCOUNTER — Other Ambulatory Visit: Payer: Self-pay

## 2019-02-09 ENCOUNTER — Ambulatory Visit (HOSPITAL_COMMUNITY): Payer: Medicare Other

## 2019-02-09 ENCOUNTER — Ambulatory Visit
Admission: RE | Admit: 2019-02-09 | Discharge: 2019-02-09 | Disposition: A | Discharge status: Home / Self Care | Payer: Medicare Other | Source: Ambulatory Visit | Attending: Nephrology | Admitting: Nephrology

## 2019-02-09 DIAGNOSIS — N189 Chronic kidney disease, unspecified: Secondary | ICD-10-CM | POA: Diagnosis not present

## 2019-02-17 ENCOUNTER — Other Ambulatory Visit: Payer: Self-pay | Admitting: Nephrology

## 2019-02-17 DIAGNOSIS — N183 Chronic kidney disease, stage 3 unspecified: Secondary | ICD-10-CM

## 2019-02-17 DIAGNOSIS — I10 Essential (primary) hypertension: Secondary | ICD-10-CM

## 2019-02-17 DIAGNOSIS — R809 Proteinuria, unspecified: Secondary | ICD-10-CM

## 2019-04-26 ENCOUNTER — Other Ambulatory Visit: Payer: Self-pay | Admitting: Family

## 2019-04-26 DIAGNOSIS — I10 Essential (primary) hypertension: Secondary | ICD-10-CM

## 2019-05-04 ENCOUNTER — Telehealth: Payer: Self-pay | Admitting: Family

## 2019-05-04 NOTE — Telephone Encounter (Signed)
call   Your mammogram and dexa appear due  Please let me know once you have scheduled. Patient may call [facility] for both

## 2019-05-05 NOTE — Telephone Encounter (Signed)
Patient called and informed that mammogram was due & that she could call to get scheduled at [facility].

## 2019-06-26 ENCOUNTER — Ambulatory Visit: Payer: Medicare Other | Attending: Internal Medicine

## 2019-09-19 IMAGING — MG MM DIGITAL SCREENING BILAT W/ TOMO W/ CAD
6 of 10 series · 6 of 30 positions shown · non-contrast
Comparison: Previous exam(s).

CLINICAL DATA: Screening.

EXAM:
DIGITAL SCREENING BILATERAL MAMMOGRAM WITH TOMO AND CAD

[L MLO synth-2D]
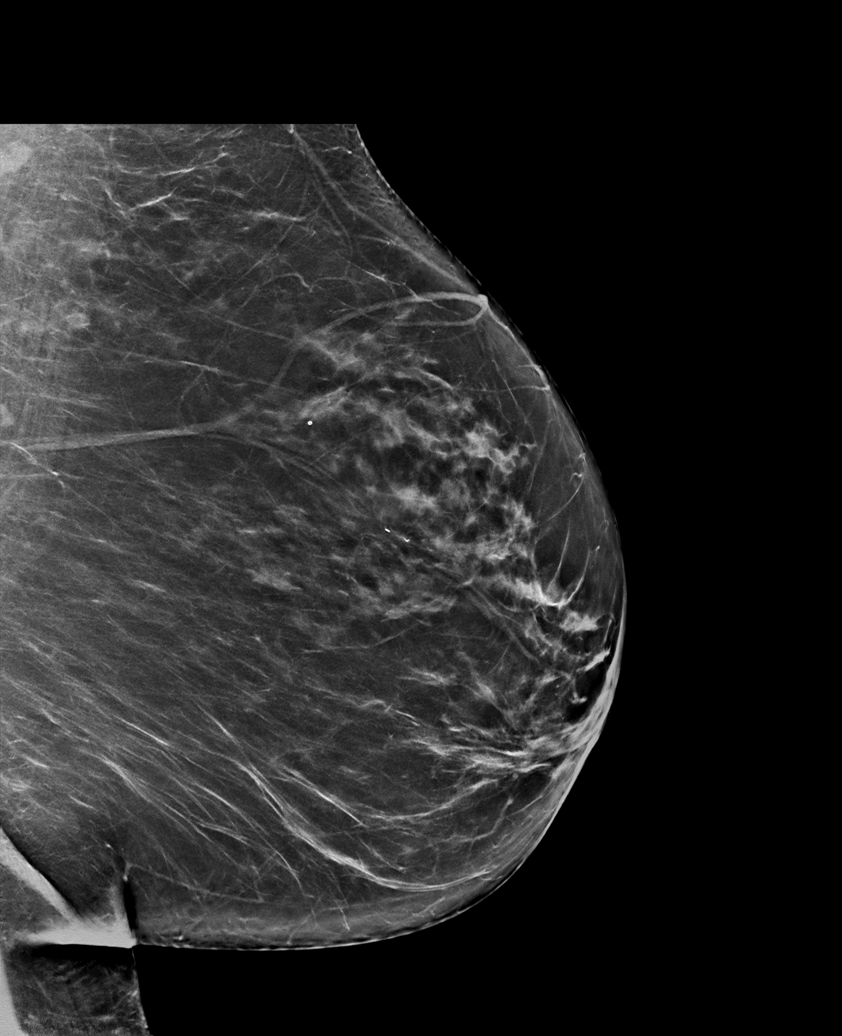

[L CC synth-2D]
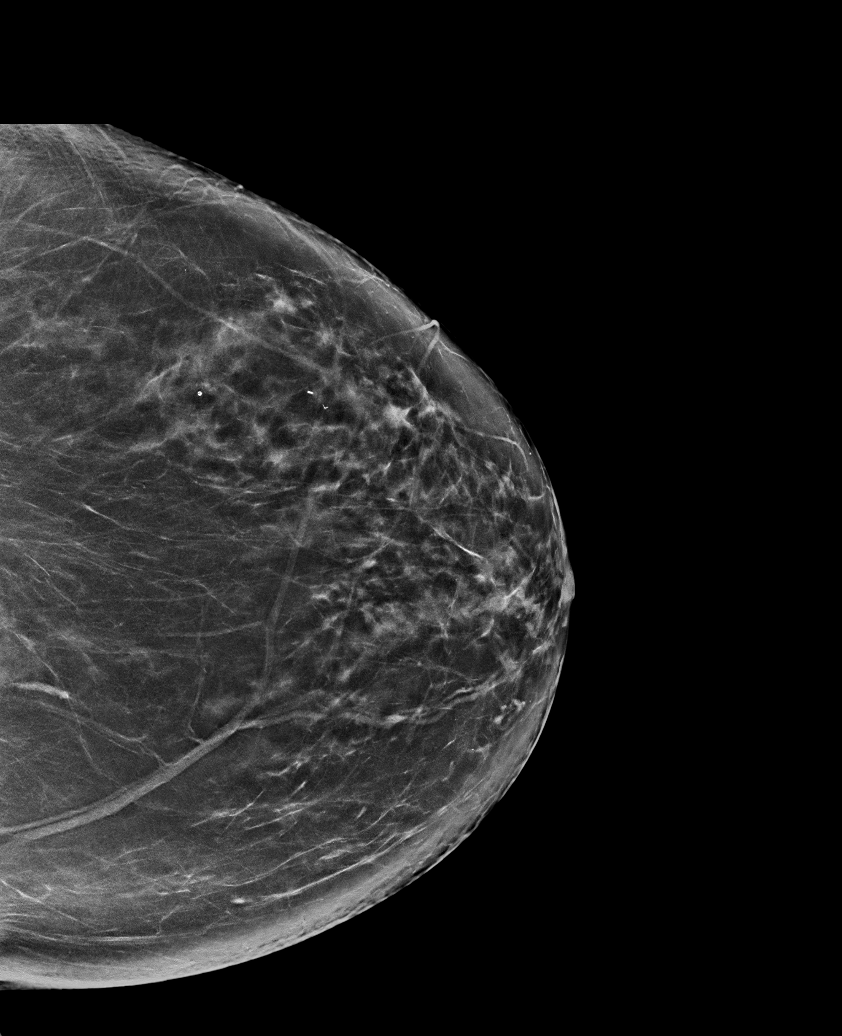

[R XCCL synth-2D]
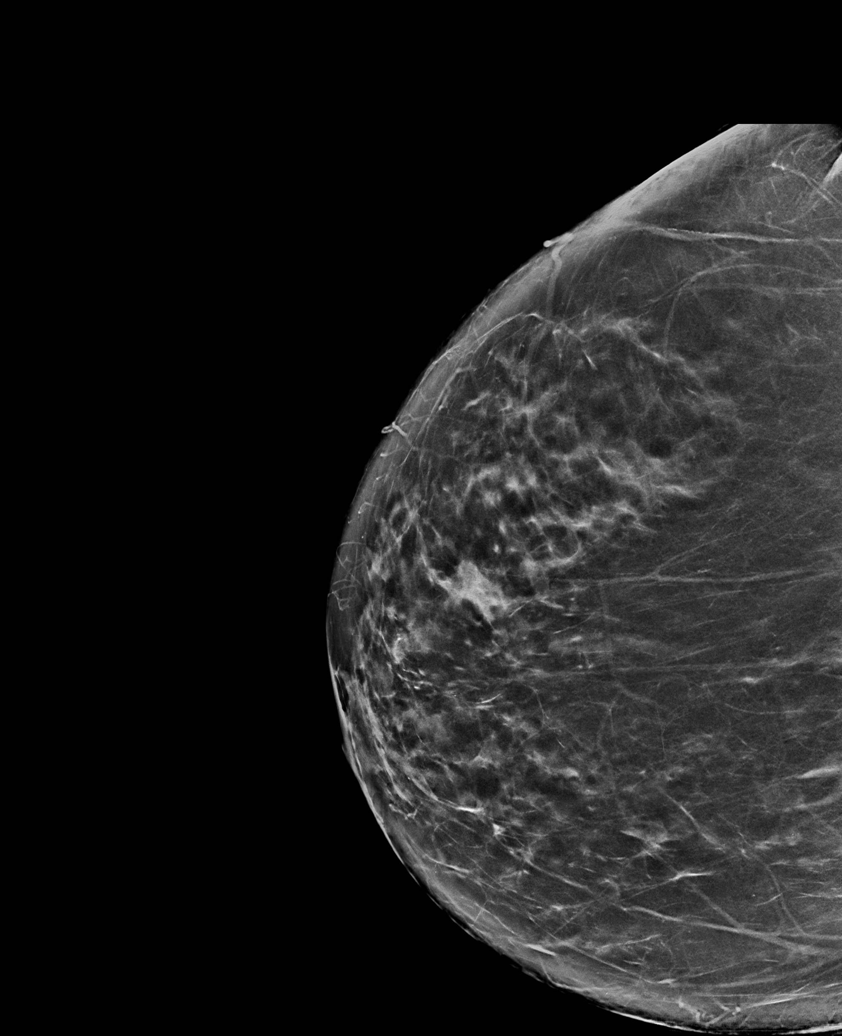

[R MLO synth-2D]
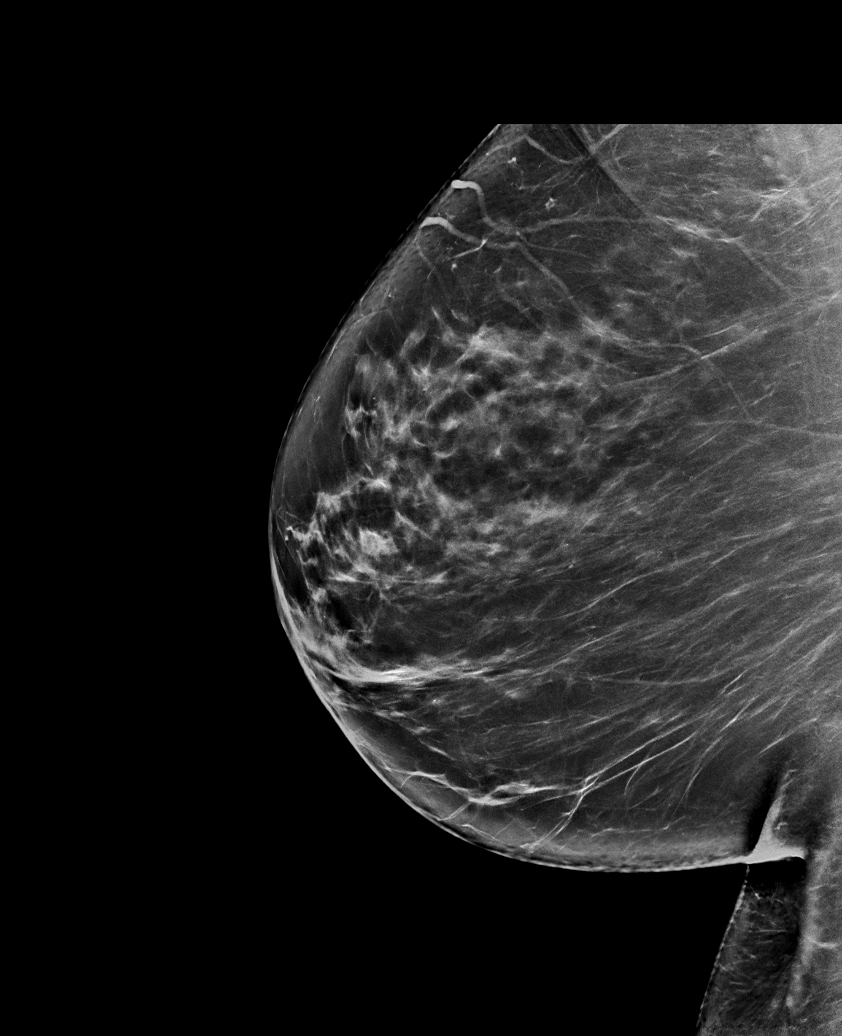

[R CC synth-2D]
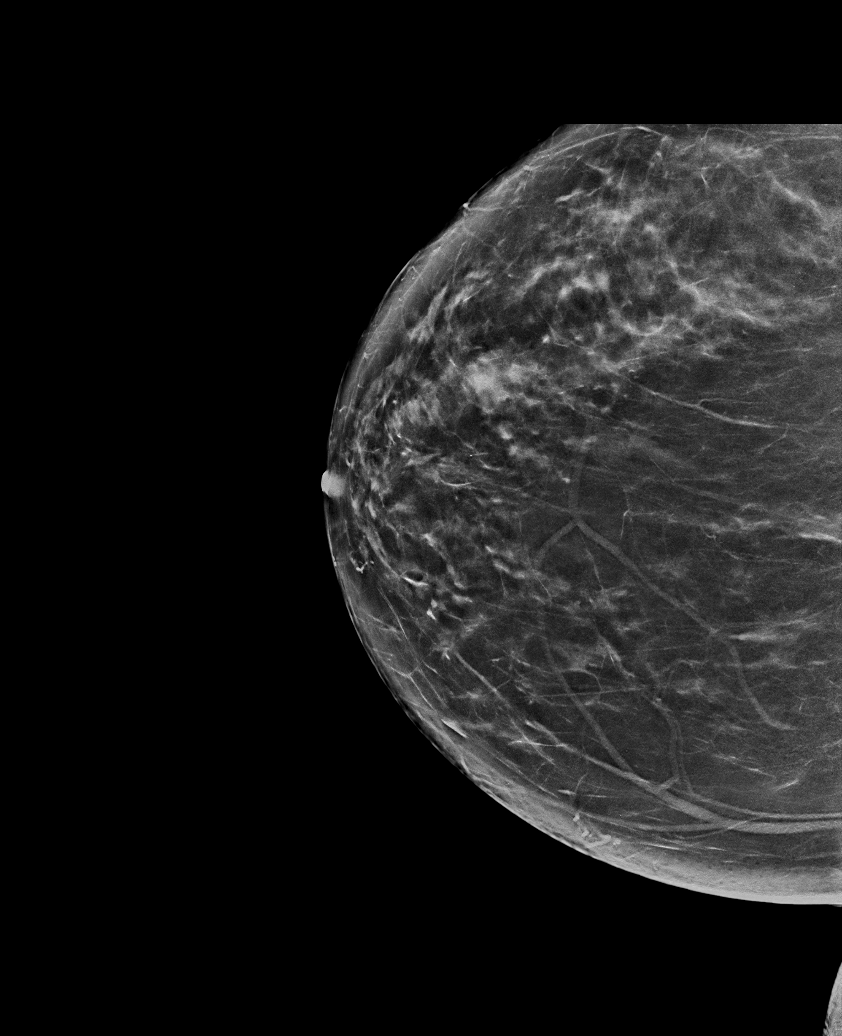

[R XCCL tomo · tomo slice 39/76.0]
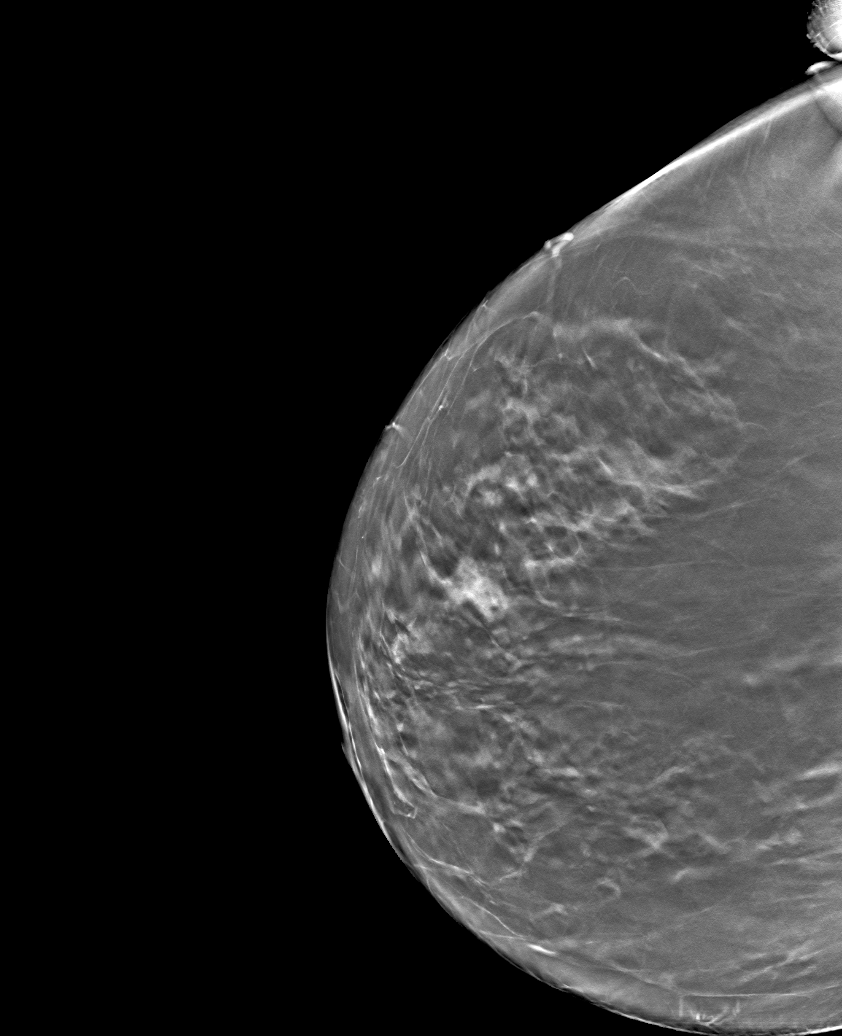

[6 of 30 positions shown; findings below may reference images not displayed]

ACR Breast Density Category b: There are scattered areas of
fibroglandular density.
FINDINGS: There are no findings suspicious for malignancy. Images were
processed with CAD.
IMPRESSION: No mammographic evidence of malignancy. A result letter of this
screening mammogram will be mailed directly to the patient.

RECOMMENDATION:
Screening mammogram in one year. (Code:[id number])

BI-RADS CATEGORY  1: Negative.

## 2019-11-28 ENCOUNTER — Other Ambulatory Visit: Payer: Self-pay | Admitting: Family

## 2019-12-09 ENCOUNTER — Other Ambulatory Visit: Payer: Self-pay

## 2019-12-09 ENCOUNTER — Encounter: Payer: Self-pay | Admitting: Family

## 2019-12-09 ENCOUNTER — Ambulatory Visit (INDEPENDENT_AMBULATORY_CARE_PROVIDER_SITE_OTHER): Payer: Medicare Other | Admitting: Family

## 2019-12-09 VITALS — BP 128/72 | HR 89 | Temp 98.7°F | Ht 64.49 in | Wt 180.0 lb

## 2019-12-09 DIAGNOSIS — Z0001 Encounter for general adult medical examination with abnormal findings: Secondary | ICD-10-CM

## 2019-12-09 DIAGNOSIS — I10 Essential (primary) hypertension: Secondary | ICD-10-CM

## 2019-12-09 DIAGNOSIS — R7303 Prediabetes: Secondary | ICD-10-CM | POA: Diagnosis not present

## 2019-12-09 DIAGNOSIS — K760 Fatty (change of) liver, not elsewhere classified: Secondary | ICD-10-CM | POA: Diagnosis not present

## 2019-12-09 DIAGNOSIS — E559 Vitamin D deficiency, unspecified: Secondary | ICD-10-CM | POA: Diagnosis not present

## 2019-12-09 LAB — CBC WITH DIFFERENTIAL/PLATELET
Basophils Absolute: 0 10*3/uL (ref 0.0–0.1)
Basophils Relative: 0.6 % (ref 0.0–3.0)
Eosinophils Absolute: 0.2 10*3/uL (ref 0.0–0.7)
Eosinophils Relative: 2.3 % (ref 0.0–5.0)
HCT: 38.5 % (ref 36.0–46.0)
Hemoglobin: 13.4 g/dL (ref 12.0–15.0)
Lymphocytes Relative: 30.7 % (ref 12.0–46.0)
Lymphs Abs: 2 10*3/uL (ref 0.7–4.0)
MCHC: 34.8 g/dL (ref 30.0–36.0)
MCV: 89.6 fl (ref 78.0–100.0)
Monocytes Absolute: 0.5 10*3/uL (ref 0.1–1.0)
Monocytes Relative: 8.3 % (ref 3.0–12.0)
Neutro Abs: 3.7 10*3/uL (ref 1.4–7.7)
Neutrophils Relative %: 58.1 % (ref 43.0–77.0)
Platelets: 183 10*3/uL (ref 150.0–400.0)
RBC: 4.3 Mil/uL (ref 3.87–5.11)
RDW: 14.4 % (ref 11.5–15.5)
WBC: 6.4 10*3/uL (ref 4.0–10.5)

## 2019-12-09 LAB — COMPREHENSIVE METABOLIC PANEL
ALT: 21 U/L (ref 0–35)
AST: 23 U/L (ref 0–37)
Albumin: 4.6 g/dL (ref 3.5–5.2)
Alkaline Phosphatase: 59 U/L (ref 39–117)
BUN: 19 mg/dL (ref 6–23)
CO2: 27 mEq/L (ref 19–32)
Calcium: 9.9 mg/dL (ref 8.4–10.5)
Chloride: 102 mEq/L (ref 96–112)
Creatinine, Ser: 1.21 mg/dL — ABNORMAL HIGH (ref 0.40–1.20)
GFR: 43.11 mL/min — ABNORMAL LOW (ref >60.00)
Glucose, Bld: 98 mg/dL (ref 70–99)
Potassium: 4 mEq/L (ref 3.5–5.1)
Sodium: 137 mEq/L (ref 135–145)
Total Bilirubin: 0.9 mg/dL (ref 0.2–1.2)
Total Protein: 7 g/dL (ref 6.0–8.3)

## 2019-12-09 LAB — LIPID PANEL
Cholesterol: 177 mg/dL (ref 0–200)
HDL: 68.9 mg/dL (ref >39.00)
LDL Cholesterol: 88 mg/dL (ref 0–99)
NonHDL: 108.51
Total CHOL/HDL Ratio: 3
Triglycerides: 104 mg/dL (ref 0.0–149.0)
VLDL: 20.8 mg/dL (ref 0.0–40.0)

## 2019-12-09 LAB — TSH: TSH: 1.73 u[IU]/mL (ref 0.35–4.50)

## 2019-12-09 LAB — VITAMIN D 25 HYDROXY (VIT D DEFICIENCY, FRACTURES): VITD: 25.82 ng/mL — ABNORMAL LOW (ref 30.00–100.00)

## 2019-12-09 LAB — HEMOGLOBIN A1C: Hgb A1c MFr Bld: 6.1 % (ref 4.6–6.5)

## 2019-12-09 MED ORDER — AMLODIPINE BESYLATE 2.5 MG PO TABS: 2.5000 mg | ORAL_TABLET | Freq: Every day | ORAL | 3 refills | Status: DC

## 2019-12-09 NOTE — Assessment & Plan Note (Signed)
Reviewed scanned 2009 [place] image with patient today and advised repeat ultrasound for surveillance in regards to progression of liver disease.  Education provided in regards to progression and liver disease to cirrhosis and liver cancer.  Patient politely declines imaging and verbalized understanding of all.  I will however look at liver enzymes today.

## 2019-12-09 NOTE — Progress Notes (Signed)
Subjective:    Patient ID: Hannah Lee, female    DOB: 03/07/1941, 79 y.o.   MRN: [id number]  CC: Hannah Lee is a 79 y.o. female who presents today for physical exam.    HPI: Feels well today No complaints  HTN- taking lisinopril 10mg  . 145/70, 135/70.   Family history of aortic aneurysm.  Denies abdominal pain.  CKD- follows with Dr Candiss Norse  2009 US showed fatty liver, renal cysts  Colorectal Cancer Screening: Done in 2018 Dr Amie Critchley. Told never needed another colonoscopy. Normal bowel habits. No constipation. Sister had colon cancer.  Breast Cancer Screening: Mammogram due Cervical Cancer Screening: no h/o abnormal pap smear. No vaginal bleeding Bone Health screening/DEXA for 65+: due Lung Cancer Screening: Doesn't have 30 year pack year history and age > 81 years.       Tetanus - due        Pneumococcal - Completed Hepatitis C screening - Candidate for, declines Labs: Screening labs today. Exercise: Gets regular exercise.  Alcohol use: glass of wine once per week Smoking/tobacco use: former smoker.  Wears seat belt: Yes. Follows with dermatology   HISTORY:  Past Medical History:  Diagnosis Date  . Allergy    Seasonal / Environmental - trees, pollen, dust  . Arthritis   . Chicken pox   . Colon polyp 2011   Colonoscopy  . Hyperlipidemia   . Hypertension   . Kidney stones    Told had through test    Past Surgical History:  Procedure Laterality Date  . COLONOSCOPY WITH PROPOFOL N/A 12/06/2016   Procedure: COLONOSCOPY WITH PROPOFOL;  Surgeon: [name], MD;  Location: [facility] END[facility]COPY;  Service: Endoscopy;  Laterality: N/A;  . TONSILLECTOMY AND ADENOIDECTOMY  1951   Family History  Problem Relation Age of Onset  . Hyperlipidemia Father   . Hypertension Father   . Stroke Father   . Colon cancer Sister        colon cancer  . Alzheimer's disease Sister   . Aortic aneurysm Brother 43       Ruptured Aneurysm  . Cancer Mother        Gall bladder  .  Heart disease Paternal Grandmother   . Heart disease Paternal Grandfather   . Cancer Daughter 49       died of leukemia  . Aortic aneurysm Son       ALLERGIES: Influenza vaccines  Current Outpatient Medications on File Prior to Visit  Medication Sig Dispense Refill  . lisinopril (ZESTRIL) 10 MG tablet TAKE 1 TABLET BY MOUTH  DAILY 90 tablet 3  . montelukast (SINGULAIR) 10 MG tablet TAKE 1 TABLET BY MOUTH AT  BEDTIME 60 tablet 0  . rosuvastatin (CRESTOR) 40 MG tablet TAKE 1 TABLET BY MOUTH  DAILY 90 tablet 3   No current facility-administered medications on file prior to visit.    Social History   Tobacco Use  . Smoking status: Former Smoker    Years: 2.00    Quit date: 10/30/1963    Years since quitting: 56.1  . Smokeless tobacco: Never Used  Vaping Use  . Vaping Use: Never used  Substance Use Topics  . Alcohol use: Yes    Alcohol/week: 7.0 standard drinks    Types: 7 Standard drinks or equivalent per week    Comment: Wine - about a glass or two on weekends  . Drug use: No    Review of Systems  Constitutional: Negative for chills, fever and unexpected weight change.  HENT: Negative for congestion.   Respiratory: Negative for cough.   Cardiovascular: Negative for chest pain, palpitations and leg swelling.  Gastrointestinal: Negative for abdominal distention, abdominal pain, constipation, nausea and vomiting.  Genitourinary: Negative for pelvic pain and vaginal bleeding.  Musculoskeletal: Negative for arthralgias and myalgias.  Skin: Negative for rash.  Neurological: Negative for headaches.  Hematological: Negative for adenopathy.  Psychiatric/Behavioral: Negative for confusion.      Objective:    BP 128/72 (BP Location: Left Arm, Patient Position: Sitting)   Pulse 89   Temp 98.7 F (37.1 C)   Ht 5' 4.49" (1.638 m)   Wt 180 lb (81.6 kg)   SpO2 99%   BMI 30.43 kg/m   BP Readings from Last 3 Encounters:  12/09/19 128/72  01/30/19 (!) 148/62  10/29/17  130/64   Wt Readings from Last 3 Encounters:  12/09/19 180 lb (81.6 kg)  01/30/19 179 lb 6.4 oz (81.4 kg)  10/29/17 182 lb 1.9 oz (82.6 kg)    Physical Exam Vitals reviewed.  Constitutional:      Appearance: Normal appearance. She is well-developed.  Eyes:     Conjunctiva/sclera: Conjunctivae normal.  Neck:     Thyroid: No thyroid mass or thyromegaly.  Cardiovascular:     Rate and Rhythm: Normal rate and regular rhythm.     Pulses: Normal pulses.     Heart sounds: Normal heart sounds.  Pulmonary:     Effort: Pulmonary effort is normal.     Breath sounds: Normal breath sounds. No wheezing, rhonchi or rales.  Abdominal:     General: Bowel sounds are normal. There is no distension.     Palpations: Abdomen is soft. Abdomen is not rigid. There is no fluid wave or mass.     Tenderness: There is no abdominal tenderness. There is no guarding or rebound.     Comments: No bruit. No pulsatile mass appreciated on exam  Lymphadenopathy:     Head:     Right side of head: No submental, submandibular, tonsillar, preauricular, posterior auricular or occipital adenopathy.     Left side of head: No submental, submandibular, tonsillar, preauricular, posterior auricular or occipital adenopathy.     Cervical: No cervical adenopathy.  Skin:    General: Skin is warm and dry.  Neurological:     Mental Status: She is alert.  Psychiatric:        Speech: Speech normal.        Behavior: Behavior normal.        Thought Content: Thought content normal.        Assessment & Plan:   Problem List Items Addressed This Visit      Cardiovascular and Mediastinum   Essential hypertension     slightly elevated, even more so at home.  We jointly agreed to start low-dose amlodipine in addition to lisinopril 10 mg.  Advised patient of importance to ensure that she has lab done at least twice a year particular since she is on lisinopril.      Relevant Medications   amLODipine (NORVASC) 2.5 MG tablet      Digestive   Fatty liver    Reviewed scanned 2009 [place] image with patient today and advised repeat ultrasound for surveillance in regards to progression of liver disease.  Education provided in regards to progression and liver disease to cirrhosis and liver cancer.  Patient politely declines imaging and verbalized understanding of all.  I will however look at liver enzymes today.  Other   Encounter for preventative adult health care exam with abnormal findings - Primary    Patient politely declines clinical breast exam or pelvic exam.  She is no longer doing cervical cancer screening.  She has no pelvic complaints. Mammogram ordered.  Patient understands to schedule. We discussed family history of aortic aneurysm.  Advised consult with vascular to determine her risk, surveillance. I also advised abdominal ultrasound today.  Patient politely declines and may consider this in the future.      Relevant Orders   TSH   CBC with Differential/Platelet   Comprehensive metabolic panel   Hemoglobin A1c   Lipid panel   VITAMIN D 25 Hydroxy (Vit-D Deficiency, Fractures)   MM 3D SCREEN BREAST BILATERAL   DG Bone Density       I am having Kalli Christians start on amLODipine. I am also having her maintain her montelukast, lisinopril, and rosuvastatin.   Meds ordered this encounter  Medications  . amLODipine (NORVASC) 2.5 MG tablet    Sig: Take 1 tablet (2.5 mg total) by mouth daily.    Dispense:  90 tablet    Refill:  3    Order Specific Question:   Supervising Provider    Answer:   Crecencio Mc [[id number]]    Return precautions given.   Risks, benefits, and alternatives of the medications and treatment plan prescribed today were discussed, and patient expressed understanding.   Education regarding symptom management and diagnosis given to patient on AVS.   Continue to follow with Burnard Hawthorne, FNP for routine health maintenance.   Hannah Lee and I agreed with plan.    Mable Paris, FNP

## 2019-12-09 NOTE — Assessment & Plan Note (Addendum)
slightly elevated, even more so at home.  We jointly agreed to start low-dose amlodipine in addition to lisinopril 10 mg.  Advised patient of importance to ensure that she has lab done at least twice a year particular since she is on lisinopril.

## 2019-12-09 NOTE — Patient Instructions (Addendum)
Please call  and schedule your 3D mammogram, bone density scan as discussed.   [place]  [place], [place]  Start amlodipine  It is imperative that you are seen AT least twice per year for labs and monitoring. Monitor blood pressure at home and me 5-6 reading on separate days. Goal is less than 120/80, based on newest guidelines, however we certainly want to be less than 130/80;  if persistently higher, please make sooner follow up appointment so we can recheck you blood pressure and manage/ adjust medications.  Stay safe   Health Maintenance for Postmenopausal Women Menopause is a normal process in which your ability to get pregnant comes to an end. This process happens slowly over many months or years, usually between the ages of 40 and 33. Menopause is complete when you have missed your menstrual periods for 12 months. It is important to talk with your health care provider about some of the most common conditions that affect women after menopause (postmenopausal women). These include heart disease, cancer, and bone loss (osteoporosis). Adopting a healthy lifestyle and getting preventive care can help to promote your health and wellness. The actions you take can also lower your chances of developing some of these common conditions. What should I know about menopause? During menopause, you may get a number of symptoms, such as:  Hot flashes. These can be moderate or severe.  Night sweats.  Decrease in sex drive.  Mood swings.  Headaches.  Tiredness.  Irritability.  Memory problems.  Insomnia. Choosing to treat or not to treat these symptoms is a decision that you make with your health care provider. Do I need hormone replacement therapy?  Hormone replacement therapy is effective in treating symptoms that are caused by menopause, such as hot flashes and night sweats.  Hormone replacement carries certain risks,  especially as you become older. If you are thinking about using estrogen or estrogen with progestin, discuss the benefits and risks with your health care provider. What is my risk for heart disease and stroke? The risk of heart disease, heart attack, and stroke increases as you age. One of the causes may be a change in the body's hormones during menopause. This can affect how your body uses dietary fats, triglycerides, and cholesterol. Heart attack and stroke are medical emergencies. There are many things that you can do to help prevent heart disease and stroke. Watch your blood pressure  High blood pressure causes heart disease and increases the risk of stroke. This is more likely to develop in people who have high blood pressure readings, are of African descent, or are overweight.  Have your blood pressure checked: ? Every 3-5 years if you are 72-11 years of age. ? Every year if you are 61 years old or older. Eat a healthy diet   Eat a diet that includes plenty of vegetables, fruits, low-fat dairy products, and lean protein.  Do not eat a lot of foods that are high in solid fats, added sugars, or sodium. Get regular exercise Get regular exercise. This is one of the most important things you can do for your health. Most adults should:  Try to exercise for at least 150 minutes each week. The exercise should increase your heart rate and make you sweat (moderate-intensity exercise).  Try to do strengthening exercises at least twice each week. Do these in addition to the moderate-intensity exercise.  Spend less time sitting. Even light physical activity can  be beneficial. Other tips  Work with your health care provider to achieve or maintain a healthy weight.  Do not use any products that contain nicotine or tobacco, such as cigarettes, e-cigarettes, and chewing tobacco. If you need help quitting, ask your health care provider.  Know your numbers. Ask your health care provider to check  your cholesterol and your blood sugar (glucose). Continue to have your blood tested as directed by your health care provider. Do I need screening for cancer? Depending on your health history and family history, you may need to have cancer screening at different stages of your life. This may include screening for:  Breast cancer.  Cervical cancer.  Lung cancer.  Colorectal cancer. What is my risk for osteoporosis? After menopause, you may be at increased risk for osteoporosis. Osteoporosis is a condition in which bone destruction happens more quickly than new bone creation. To help prevent osteoporosis or the bone fractures that can happen because of osteoporosis, you may take the following actions:  If you are 61-74 years old, get at least 1,000 mg of calcium and at least 600 mg of vitamin D per day.  If you are older than age 57 but younger than age 39, get at least 1,200 mg of calcium and at least 600 mg of vitamin D per day.  If you are older than age 67, get at least 1,200 mg of calcium and at least 800 mg of vitamin D per day. Smoking and drinking excessive alcohol increase the risk of osteoporosis. Eat foods that are rich in calcium and vitamin D, and do weight-bearing exercises several times each week as directed by your health care provider. How does menopause affect my mental health? Depression may occur at any age, but it is more common as you become older. Common symptoms of depression include:  Low or sad mood.  Changes in sleep patterns.  Changes in appetite or eating patterns.  Feeling an overall lack of motivation or enjoyment of activities that you previously enjoyed.  Frequent crying spells. Talk with your health care provider if you think that you are experiencing depression. General instructions See your health care provider for regular wellness exams and vaccines. This may include:  Scheduling regular health, dental, and eye exams.  Getting and maintaining  your vaccines. These include: ? Influenza vaccine. Get this vaccine each year before the flu season begins. ? Pneumonia vaccine. ? Shingles vaccine. ? Tetanus, diphtheria, and pertussis (Tdap) booster vaccine. Your health care provider may also recommend other immunizations. Tell your health care provider if you have ever been abused or do not feel safe at home. Summary  Menopause is a normal process in which your ability to get pregnant comes to an end.  This condition causes hot flashes, night sweats, decreased interest in sex, mood swings, headaches, or lack of sleep.  Treatment for this condition may include hormone replacement therapy.  Take actions to keep yourself healthy, including exercising regularly, eating a healthy diet, watching your weight, and checking your blood pressure and blood sugar levels.  Get screened for cancer and depression. Make sure that you are up to date with all your vaccines. This information is not intended to replace advice given to you by your health care provider. Make sure you discuss any questions you have with your health care provider. Document Revised: 04/23/2018 Document Reviewed: 04/23/2018 Elsevier Patient Education  [place].    Managing Your Hypertension Hypertension is commonly called high blood pressure. This is  when the force of your blood pressing against the walls of your arteries is too strong. Arteries are blood vessels that carry blood from your heart throughout your body. Hypertension forces the heart to work harder to pump blood, and may cause the arteries to become narrow or stiff. Having untreated or uncontrolled hypertension can cause heart attack, stroke, kidney disease, and other problems. What are blood pressure readings? A blood pressure reading consists of a higher number over a lower number. Ideally, your blood pressure should be below 120/80. The first ("top") number is called the systolic pressure. It is a  measure of the pressure in your arteries as your heart beats. The second ("bottom") number is called the diastolic pressure. It is a measure of the pressure in your arteries as the heart relaxes. What does my blood pressure reading mean? Blood pressure is classified into four stages. Based on your blood pressure reading, your health care provider may use the following stages to determine what type of treatment you need, if any. Systolic pressure and diastolic pressure are measured in a unit called mm Hg. Normal  Systolic pressure: below 322.  Diastolic pressure: below 80. Elevated  Systolic pressure: 025-427.  Diastolic pressure: below 80. Hypertension stage 1  Systolic pressure: 062-376.  Diastolic pressure: 28-31. Hypertension stage 2  Systolic pressure: 517 or above.  Diastolic pressure: 90 or above. What health risks are associated with hypertension? Managing your hypertension is an important responsibility. Uncontrolled hypertension can lead to:  A heart attack.  A stroke.  A weakened blood vessel (aneurysm).  Heart failure.  Kidney damage.  Eye damage.  Metabolic syndrome.  Memory and concentration problems. What changes can I make to manage my hypertension? Hypertension can be managed by making lifestyle changes and possibly by taking medicines. Your health care provider will help you make a plan to bring your blood pressure within a normal range. Eating and drinking   Eat a diet that is high in fiber and potassium, and low in salt (sodium), added sugar, and fat. An example eating plan is called the DASH (Dietary Approaches to Stop Hypertension) diet. To eat this way: ? Eat plenty of fresh fruits and vegetables. Try to fill half of your plate at each meal with fruits and vegetables. ? Eat whole grains, such as whole wheat pasta, brown rice, or whole grain bread. Fill about one quarter of your plate with whole grains. ? Eat low-fat diary products. ? Avoid fatty  cuts of meat, processed or cured meats, and poultry with skin. Fill about one quarter of your plate with lean proteins such as fish, chicken without skin, beans, eggs, and tofu. ? Avoid premade and processed foods. These tend to be higher in sodium, added sugar, and fat.  Reduce your daily sodium intake. Most people with hypertension should eat less than 1,500 mg of sodium a day.  Limit alcohol intake to no more than 1 drink a day for nonpregnant women and 2 drinks a day for men. One drink equals 12 oz of beer, 5 oz of wine, or 1 oz of hard liquor. Lifestyle  Work with your health care provider to maintain a healthy body weight, or to lose weight. Ask what an ideal weight is for you.  Get at least 30 minutes of exercise that causes your heart to beat faster (aerobic exercise) most days of the week. Activities may include walking, swimming, or biking.  Include exercise to strengthen your muscles (resistance exercise), such as weight lifting, as  part of your weekly exercise routine. Try to do these types of exercises for 30 minutes at least 3 days a week.  Do not use any products that contain nicotine or tobacco, such as cigarettes and e-cigarettes. If you need help quitting, ask your health care provider.  Control any long-term (chronic) conditions you have, such as high cholesterol or diabetes. Monitoring  Monitor your blood pressure at home as told by your health care provider. Your personal target blood pressure may vary depending on your medical conditions, your age, and other factors.  Have your blood pressure checked regularly, as often as told by your health care provider. Working with your health care provider  Review all the medicines you take with your health care provider because there may be side effects or interactions.  Talk with your health care provider about your diet, exercise habits, and other lifestyle factors that may be contributing to hypertension.  Visit your  health care provider regularly. Your health care provider can help you create and adjust your plan for managing hypertension. Will I need medicine to control my blood pressure? Your health care provider may prescribe medicine if lifestyle changes are not enough to get your blood pressure under control, and if:  Your systolic blood pressure is 130 or higher.  Your diastolic blood pressure is 80 or higher. Take medicines only as told by your health care provider. Follow the directions carefully. Blood pressure medicines must be taken as prescribed. The medicine does not work as well when you skip doses. Skipping doses also puts you at risk for problems. Contact a health care provider if:  You think you are having a reaction to medicines you have taken.  You have repeated (recurrent) headaches.  You feel dizzy.  You have swelling in your ankles.  You have trouble with your vision. Get help right away if:  You develop a severe headache or confusion.  You have unusual weakness or numbness, or you feel faint.  You have severe pain in your chest or abdomen.  You vomit repeatedly.  You have trouble breathing. Summary  Hypertension is when the force of blood pumping through your arteries is too strong. If this condition is not controlled, it may put you at risk for serious complications.  Your personal target blood pressure may vary depending on your medical conditions, your age, and other factors. For most people, a normal blood pressure is less than 120/80.  Hypertension is managed by lifestyle changes, medicines, or both. Lifestyle changes include weight loss, eating a healthy, low-sodium diet, exercising more, and limiting alcohol. This information is not intended to replace advice given to you by your health care provider. Make sure you discuss any questions you have with your health care provider. Document Revised: 08/22/2018 Document Reviewed: 03/28/2016 Elsevier Patient  Education  [place].

## 2019-12-09 NOTE — Assessment & Plan Note (Signed)
Patient politely declines clinical breast exam or pelvic exam.  She is no longer doing cervical cancer screening.  She has no pelvic complaints. Mammogram ordered.  Patient understands to schedule. We discussed family history of aortic aneurysm.  Advised consult with vascular to determine her risk, surveillance. I also advised abdominal ultrasound today.  Patient politely declines and may consider this in the future.

## 2019-12-11 ENCOUNTER — Other Ambulatory Visit: Payer: Self-pay | Admitting: Family

## 2019-12-11 DIAGNOSIS — I120 Hypertensive chronic kidney disease with stage 5 chronic kidney disease or end stage renal disease: Secondary | ICD-10-CM

## 2020-01-04 ENCOUNTER — Ambulatory Visit
Admission: RE | Admit: 2020-01-04 | Discharge: 2020-01-04 | Disposition: A | Discharge status: Home / Self Care | Payer: Medicare Other | Source: Ambulatory Visit | Attending: Family | Admitting: Family

## 2020-01-04 ENCOUNTER — Other Ambulatory Visit: Payer: Self-pay

## 2020-01-04 DIAGNOSIS — Z13820 Encounter for screening for osteoporosis: Secondary | ICD-10-CM | POA: Diagnosis not present

## 2020-01-04 DIAGNOSIS — Z0001 Encounter for general adult medical examination with abnormal findings: Secondary | ICD-10-CM

## 2020-01-04 DIAGNOSIS — Z780 Asymptomatic menopausal state: Secondary | ICD-10-CM | POA: Diagnosis not present

## 2020-01-04 DIAGNOSIS — Z1231 Encounter for screening mammogram for malignant neoplasm of breast: Secondary | ICD-10-CM | POA: Insufficient documentation

## 2020-01-04 DIAGNOSIS — M85851 Other specified disorders of bone density and structure, right thigh: Secondary | ICD-10-CM | POA: Diagnosis not present

## 2020-01-04 DIAGNOSIS — E559 Vitamin D deficiency, unspecified: Secondary | ICD-10-CM | POA: Diagnosis not present

## 2020-01-04 DIAGNOSIS — M8580 Other specified disorders of bone density and structure, unspecified site: Secondary | ICD-10-CM | POA: Diagnosis not present

## 2020-02-02 DIAGNOSIS — I10 Essential (primary) hypertension: Secondary | ICD-10-CM | POA: Diagnosis not present

## 2020-02-02 DIAGNOSIS — N1832 Chronic kidney disease, stage 3b: Secondary | ICD-10-CM | POA: Diagnosis not present

## 2020-03-23 ENCOUNTER — Other Ambulatory Visit: Payer: Self-pay | Admitting: Family

## 2020-03-23 DIAGNOSIS — I10 Essential (primary) hypertension: Secondary | ICD-10-CM

## 2020-06-10 ENCOUNTER — Ambulatory Visit (INDEPENDENT_AMBULATORY_CARE_PROVIDER_SITE_OTHER): Payer: Medicare Other | Admitting: Family

## 2020-06-10 ENCOUNTER — Other Ambulatory Visit: Payer: Self-pay

## 2020-06-10 ENCOUNTER — Encounter: Payer: Self-pay | Admitting: Family

## 2020-06-10 VITALS — BP 110/72 | HR 84 | Temp 97.8°F | Ht 64.49 in | Wt 187.2 lb

## 2020-06-10 DIAGNOSIS — I10 Essential (primary) hypertension: Secondary | ICD-10-CM

## 2020-06-10 DIAGNOSIS — E785 Hyperlipidemia, unspecified: Secondary | ICD-10-CM | POA: Diagnosis not present

## 2020-06-10 DIAGNOSIS — Z23 Encounter for immunization: Secondary | ICD-10-CM | POA: Diagnosis not present

## 2020-06-10 LAB — BASIC METABOLIC PANEL
BUN: 22 mg/dL (ref 6–23)
CO2: 27 mEq/L (ref 19–32)
Calcium: 10.1 mg/dL (ref 8.4–10.5)
Chloride: 103 mEq/L (ref 96–112)
Creatinine, Ser: 1.08 mg/dL (ref 0.40–1.20)
GFR: 49.43 mL/min — ABNORMAL LOW (ref >60.00)
Glucose, Bld: 95 mg/dL (ref 70–99)
Potassium: 4.1 mEq/L (ref 3.5–5.1)
Sodium: 139 mEq/L (ref 135–145)

## 2020-06-10 NOTE — Assessment & Plan Note (Signed)
Stable. Continue lisinopril 10mg 

## 2020-06-10 NOTE — Progress Notes (Signed)
Subjective:    Patient ID: Hannah Lee, female    DOB: Mar 16, 1941, 80 y.o.   MRN: [id number]  CC: Hannah Lee is a 80 y.o. female who presents today for follow up.   HPI: Feels well today No complaints   HTN- compliant with lisinopril; she is not on the amlodipine. No cp.   HLD- compliant with crestor  She is no longer screening with colonoscopy.Per patient, she had conversation with Dr Vicente Males that unless concerns she did not need to have colonoscopy again. No changes in BM.  CKD- follows with Dr Candiss Norse HISTORY:  Past Medical History:  Diagnosis Date  . Allergy    Seasonal / Environmental - trees, pollen, dust  . Arthritis   . Chicken pox   . Colon polyp 2011   Colonoscopy  . Hyperlipidemia   . Hypertension   . Kidney stones    Told had through test   Past Surgical History:  Procedure Laterality Date  . COLONOSCOPY WITH PROPOFOL N/A 12/06/2016   Procedure: COLONOSCOPY WITH PROPOFOL;  Surgeon: [name], MD;  Location: [facility] ENDOSCOPY;  Service: Endoscopy;  Laterality: N/A;  . TONSILLECTOMY AND ADENOIDECTOMY  1951   Family History  Problem Relation Age of Onset  . Hyperlipidemia Father   . Hypertension Father   . Stroke Father   . Colon cancer Sister        colon cancer  . Alzheimer's disease Sister   . Aortic aneurysm Brother 43       Ruptured Aneurysm  . Cancer Mother        Gall bladder  . Heart disease Paternal Grandmother   . Heart disease Paternal Grandfather   . Cancer Daughter 82       died of leukemia  . Aortic aneurysm Son     Allergies: Influenza vaccines Current Outpatient Medications on File Prior to Visit  Medication Sig Dispense Refill  . lisinopril (ZESTRIL) 10 MG tablet TAKE 1 TABLET BY MOUTH  DAILY 90 tablet 3  . rosuvastatin (CRESTOR) 40 MG tablet TAKE 1 TABLET BY MOUTH  DAILY 90 tablet 3   No current facility-administered medications on file prior to visit.    Social History   Tobacco Use  . Smoking status: Former Smoker     Years: 2.00    Quit date: 10/30/1963    Years since quitting: 56.6  . Smokeless tobacco: Never Used  Vaping Use  . Vaping Use: Never used  Substance Use Topics  . Alcohol use: Yes    Alcohol/week: 7.0 standard drinks    Types: 7 Standard drinks or equivalent per week    Comment: Wine - about a glass or two on weekends  . Drug use: No    Review of Systems  Constitutional: Negative for chills and fever.  Respiratory: Negative for cough.   Cardiovascular: Negative for chest pain and palpitations.  Gastrointestinal: Negative for abdominal pain, nausea and vomiting.      Objective:    BP 110/72   Pulse 84   Temp 97.8 F (36.6 C)   Ht 5' 4.49" (1.638 m)   Wt 187 lb 3.2 oz (84.9 kg)   SpO2 99%   BMI 31.65 kg/m  BP Readings from Last 3 Encounters:  06/10/20 110/72  12/09/19 128/72  01/30/19 (!) 148/62   Wt Readings from Last 3 Encounters:  06/10/20 187 lb 3.2 oz (84.9 kg)  12/09/19 180 lb (81.6 kg)  01/30/19 179 lb 6.4 oz (81.4 kg)    Physical Exam  Vitals reviewed.  Constitutional:      Appearance: She is well-developed and well-nourished.  Eyes:     Conjunctiva/sclera: Conjunctivae normal.  Cardiovascular:     Rate and Rhythm: Normal rate and regular rhythm.     Pulses: Normal pulses.     Heart sounds: Normal heart sounds.  Pulmonary:     Effort: Pulmonary effort is normal.     Breath sounds: Normal breath sounds. No wheezing, rhonchi or rales.  Skin:    General: Skin is warm and dry.  Neurological:     Mental Status: She is alert.  Psychiatric:        Mood and Affect: Mood and affect normal.        Speech: Speech normal.        Behavior: Behavior normal.        Thought Content: Thought content normal.        Assessment & Plan:   Problem List Items Addressed This Visit      Cardiovascular and Mediastinum   Essential hypertension - Primary    Stable. Continue lisinopril 10mg       Relevant Orders   Basic metabolic panel     Other   HLD  (hyperlipidemia)    Controlled. Continue crestor 40mg        Other Visit Diagnoses    Need for immunization against influenza       Relevant Orders   Flu Vaccine QUAD 36+ mos IM (Completed)       I have discontinued Ameriah Garlington's montelukast and amLODipine. I am also having her maintain her rosuvastatin and lisinopril.   No orders of the defined types were placed in this encounter.   Return precautions given.   Risks, benefits, and alternatives of the medications and treatment plan prescribed today were discussed, and patient expressed understanding.   Education regarding symptom management and diagnosis given to patient on AVS.  Continue to follow with Burnard Hawthorne, FNP for routine health maintenance.   Hannah Lee and I agreed with plan.   Mable Paris, FNP

## 2020-06-10 NOTE — Assessment & Plan Note (Signed)
Controlled. Continue crestor 40mg 

## 2020-06-10 NOTE — Patient Instructions (Signed)
Always so nice to see you  Tetanus ( Tdap) vaccine is due.

## 2020-07-25 ENCOUNTER — Telehealth: Payer: Self-pay | Admitting: Family

## 2020-07-25 NOTE — Telephone Encounter (Signed)
Left message for patient to call back and schedule Medicare Annual Wellness Visit (AWV)   This should be a virtual visit only=30 minutes.  Last AWV 10/29/17; please schedule at anytime with Denisa O'Brien-Blaney at [facility].

## 2020-10-12 ENCOUNTER — Other Ambulatory Visit: Payer: Self-pay

## 2020-10-12 ENCOUNTER — Ambulatory Visit (INDEPENDENT_AMBULATORY_CARE_PROVIDER_SITE_OTHER): Payer: Medicare Other

## 2020-10-12 VITALS — BP 152/88 | HR 75 | Temp 98.8°F | Resp 14 | Ht 64.99 in | Wt 191.2 lb

## 2020-10-12 DIAGNOSIS — Z0000 Encounter for general adult medical examination without abnormal findings: Secondary | ICD-10-CM | POA: Diagnosis not present

## 2020-10-12 NOTE — Patient Instructions (Addendum)
Hannah Lee , Thank you for taking time to come for your Medicare Wellness Visit. I appreciate your ongoing commitment to your health goals. Please review the following plan we discussed and let me know if I can assist you in the future.   These are the goals we discussed: Goals    . Maintain Healthy Lifestyle     Stay hydrated Exercise Healthy diet       This is a list of the screening recommended for you and due dates:  Health Maintenance  Topic Date Due  . COVID-19 Vaccine (1) Never done  . Zoster (Shingles) Vaccine (1 of 2) 01/12/2021*  . Tetanus Vaccine  10/12/2021*  . Flu Shot  12/12/2020  . DEXA scan (bone density measurement)  Completed  . Pneumonia vaccines  Completed  . HPV Vaccine  Aged Out  . Hepatitis C Screening: USPSTF Recommendation to screen - Ages 62-79 yo.  Discontinued  *Topic was postponed. The date shown is not the original due date.   Advanced directives: End of life planning; Advance aging; Advanced directives discussed.  Copy of current HCPOA/Living Will requested.    Conditions/risks identified: none new  Follow up in one year for your annual wellness visit    Preventive Care 65 Years and Older, Female Preventive care refers to lifestyle choices and visits with your health care provider that can promote health and wellness. What does preventive care include?  A yearly physical exam. This is also called an annual well check.  Dental exams once or twice a year.  Routine eye exams. Ask your health care provider how often you should have your eyes checked.  Personal lifestyle choices, including:  Daily care of your teeth and gums.  Regular physical activity.  Eating a healthy diet.  Avoiding tobacco and drug use.  Limiting alcohol use.  Practicing safe sex.  Taking low-dose aspirin every day.  Taking vitamin and mineral supplements as recommended by your health care provider. What happens during an annual well check? The services and  screenings done by your health care provider during your annual well check will depend on your age, overall health, lifestyle risk factors, and family history of disease. Counseling  Your health care provider may ask you questions about your:  Alcohol use.  Tobacco use.  Drug use.  Emotional well-being.  Home and relationship well-being.  Sexual activity.  Eating habits.  History of falls.  Memory and ability to understand (cognition).  Work and work Statistician.  Reproductive health. Screening  You may have the following tests or measurements:  Height, weight, and BMI.  Blood pressure.  Lipid and cholesterol levels. These may be checked every 5 years, or more frequently if you are over 33 years old.  Skin check.  Lung cancer screening. You may have this screening every year starting at age 75 if you have a 30-pack-year history of smoking and currently smoke or have quit within the past 15 years.  Fecal occult blood test (FOBT) of the stool. You may have this test every year starting at age 8.  Flexible sigmoidoscopy or colonoscopy. You may have a sigmoidoscopy every 5 years or a colonoscopy every 10 years starting at age 65.  Hepatitis C blood test.  Hepatitis B blood test.  Sexually transmitted disease (STD) testing.  Diabetes screening. This is done by checking your blood sugar (glucose) after you have not eaten for a while (fasting). You may have this done every 1-3 years.  Bone density scan. This is  done to screen for osteoporosis. You may have this done starting at age 27.  Mammogram. This may be done every 1-2 years. Talk to your health care provider about how often you should have regular mammograms. Talk with your health care provider about your test results, treatment options, and if necessary, the need for more tests. Vaccines  Your health care provider may recommend certain vaccines, such as:  Influenza vaccine. This is recommended every  year.  Tetanus, diphtheria, and acellular pertussis (Tdap, Td) vaccine. You may need a Td booster every 10 years.  Zoster vaccine. You may need this after age 100.  Pneumococcal 13-valent conjugate (PCV13) vaccine. One dose is recommended after age 44.  Pneumococcal polysaccharide (PPSV23) vaccine. One dose is recommended after age 40. Talk to your health care provider about which screenings and vaccines you need and how often you need them. This information is not intended to replace advice given to you by your health care provider. Make sure you discuss any questions you have with your health care provider. Document Released: 05/27/2015 Document Revised: 01/18/2016 Document Reviewed: 03/01/2015 Elsevier Interactive Patient Education  2017 [place] Prevention in the Home Falls can cause injuries. They can happen to people of all ages. There are many things you can do to make your home safe and to help prevent falls. What can I do on the outside of my home?  Regularly fix the edges of walkways and driveways and fix any cracks.  Remove anything that might make you trip as you walk through a door, such as a raised step or threshold.  Trim any bushes or trees on the path to your home.  Use bright outdoor lighting.  Clear any walking paths of anything that might make someone trip, such as rocks or tools.  Regularly check to see if handrails are loose or broken. Make sure that both sides of any steps have handrails.  Any raised decks and porches should have guardrails on the edges.  Have any leaves, snow, or ice cleared regularly.  Use sand or salt on walking paths during winter.  Clean up any spills in your garage right away. This includes oil or grease spills. What can I do in the bathroom?  Use night lights.  Install grab bars by the toilet and in the tub and shower. Do not use towel bars as grab bars.  Use non-skid mats or decals in the tub or shower.  If you  need to sit down in the shower, use a plastic, non-slip stool.  Keep the floor dry. Clean up any water that spills on the floor as soon as it happens.  Remove soap buildup in the tub or shower regularly.  Attach bath mats securely with double-sided non-slip rug tape.  Do not have throw rugs and other things on the floor that can make you trip. What can I do in the bedroom?  Use night lights.  Make sure that you have a light by your bed that is easy to reach.  Do not use any sheets or blankets that are too big for your bed. They should not hang down onto the floor.  Have a firm chair that has side arms. You can use this for support while you get dressed.  Do not have throw rugs and other things on the floor that can make you trip. What can I do in the kitchen?  Clean up any spills right away.  Avoid walking on wet floors.  Keep  items that you use a lot in easy-to-reach places.  If you need to reach something above you, use a strong step stool that has a grab bar.  Keep electrical cords out of the way.  Do not use floor polish or wax that makes floors slippery. If you must use wax, use non-skid floor wax.  Do not have throw rugs and other things on the floor that can make you trip. What can I do with my stairs?  Do not leave any items on the stairs.  Make sure that there are handrails on both sides of the stairs and use them. Fix handrails that are broken or loose. Make sure that handrails are as long as the stairways.  Check any carpeting to make sure that it is firmly attached to the stairs. Fix any carpet that is loose or worn.  Avoid having throw rugs at the top or bottom of the stairs. If you do have throw rugs, attach them to the floor with carpet tape.  Make sure that you have a light switch at the top of the stairs and the bottom of the stairs. If you do not have them, ask someone to add them for you. What else can I do to help prevent falls?  Wear shoes  that:  Do not have high heels.  Have rubber bottoms.  Are comfortable and fit you well.  Are closed at the toe. Do not wear sandals.  If you use a stepladder:  Make sure that it is fully opened. Do not climb a closed stepladder.  Make sure that both sides of the stepladder are locked into place.  Ask someone to hold it for you, if possible.  Clearly mark and make sure that you can see:  Any grab bars or handrails.  First and last steps.  Where the edge of each step is.  Use tools that help you move around (mobility aids) if they are needed. These include:  Canes.  Walkers.  Scooters.  Crutches.  Turn on the lights when you go into a dark area. Replace any light bulbs as soon as they burn out.  Set up your furniture so you have a clear path. Avoid moving your furniture around.  If any of your floors are uneven, fix them.  If there are any pets around you, be aware of where they are.  Review your medicines with your doctor. Some medicines can make you feel dizzy. This can increase your chance of falling. Ask your doctor what other things that you can do to help prevent falls. This information is not intended to replace advice given to you by your health care provider. Make sure you discuss any questions you have with your health care provider. Document Released: 02/24/2009 Document Revised: 10/06/2015 Document Reviewed: 06/04/2014 Elsevier Interactive Patient Education  2017 [employer].

## 2020-10-12 NOTE — Progress Notes (Addendum)
Subjective:   Hannah Lee is a 80 y.o. female who presents for Medicare Annual (Subsequent) preventive examination.  Review of Systems    No ROS.  Medicare Wellness   Cardiac Risk Factors include: advanced age (>14men, >76 women);hypertension     Objective:    Today's Vitals   10/12/20 1224  BP: (!) 152/88  Pulse: 75  Resp: 14  Temp: 98.8 F (37.1 C)  SpO2: 98%  Weight: 191 lb 3.2 oz (86.7 kg)  Height: 5' 4.99" (1.651 m)   Body mass index is 31.83 kg/m.  Advanced Directives 10/12/2020 10/29/2017 12/06/2016 10/26/2016  Does Patient Have a Medical Advance Directive? Yes Yes Yes Yes  Type of Paramedic of [place];Living will [place];Living will Healthcare Power of [place];Living will  Does patient want to make changes to medical advance directive? No - Patient declined No - Patient declined - No - Patient declined  Copy of [place] in Chart? No - copy requested No - copy requested No - copy requested No - copy requested    Current Medications (verified) Outpatient Encounter Medications as of 10/12/2020  Medication Sig  . lisinopril (ZESTRIL) 10 MG tablet TAKE 1 TABLET BY MOUTH  DAILY  . rosuvastatin (CRESTOR) 40 MG tablet TAKE 1 TABLET BY MOUTH  DAILY   No facility-administered encounter medications on file as of 10/12/2020.    Allergies (verified) Influenza vaccines   History: Past Medical History:  Diagnosis Date  . Allergy    Seasonal / Environmental - trees, pollen, dust  . Arthritis   . Chicken pox   . Colon polyp 2011   Colonoscopy  . Hyperlipidemia   . Hypertension   . Kidney stones    Told had through test   Past Surgical History:  Procedure Laterality Date  . COLONOSCOPY WITH PROPOFOL N/A 12/06/2016   Procedure: COLONOSCOPY WITH PROPOFOL;  Surgeon: [name], MD;  Location: [facility] ENDOSCOPY;  Service: Endoscopy;  Laterality: N/A;  . TONSILLECTOMY AND  ADENOIDECTOMY  1951   Family History  Problem Relation Age of Onset  . Hyperlipidemia Father   . Hypertension Father   . Stroke Father   . Colon cancer Sister        colon cancer  . Alzheimer's disease Sister   . Aortic aneurysm Brother 43       Ruptured Aneurysm  . Cancer Mother        Gall bladder  . Heart disease Paternal Grandmother   . Heart disease Paternal Grandfather   . Cancer Daughter 30       died of leukemia  . Aortic aneurysm Son    Social History   Socioeconomic History  . Marital status: Married    Spouse name: Jenny Reichmann  . Number of children: 2  . Years of education: 94  . Highest education level: Not on file  Occupational History  . Occupation: Retired    Comment: Hairdresser  Tobacco Use  . Smoking status: Former Smoker    Years: 2.00    Quit date: 10/30/1963    Years since quitting: 56.9  . Smokeless tobacco: Never Used  Vaping Use  . Vaping Use: Never used  Substance and Sexual Activity  . Alcohol use: Yes    Alcohol/week: 7.0 standard drinks    Types: 7 Standard drinks or equivalent per week    Comment: Wine - about a glass or two on weekends  . Drug use: No  .  Sexual activity: Never  Other Topics Concern  . Not on file  Social History Narrative   Born and raised in [place], [place]. Retirement brought down to [place] to escape the cold. Retired Theme park manager. Has recently moved, gardening, sewing. Working out every day. Walking, pilates, lifting weights at home. Married ([name]) with two children. Daughter died age 52 from leukemia. Son [name]) lives in [place]. They have a granddaughter [name]) who is attending [employer].   Social Determinants of Health   Financial Resource Strain: Low Risk   . Difficulty of Paying Living Expenses: Not hard at all  Food Insecurity: No Food Insecurity  . Worried About Charity fundraiser in the Last Year: Never true  . Ran Out of Food in the Last Year: Never true  Transportation Needs: No Transportation Needs   . Lack of Transportation (Medical): No  . Lack of Transportation (Non-Medical): No  Physical Activity: Insufficiently Active  . Days of Exercise per Week: 2 days  . Minutes of Exercise per Session: 30 min  Stress: No Stress Concern Present  . Feeling of Stress : Not at all  Social Connections: Unknown  . Frequency of Communication with Friends and Family: Not on file  . Frequency of Social Gatherings with Friends and Family: Not on file  . Attends Religious Services: Not on file  . Active Member of Clubs or Organizations: Not on file  . Attends Archivist Meetings: Not on file  . Marital Status: Married    Tobacco Counseling Counseling given: Not Answered   Clinical Intake:  Pre-visit preparation completed: Yes        Diabetes: No  How often do you need to have someone help you when you read instructions, pamphlets, or other written materials from your doctor or pharmacy?: 1 - Never    Interpreter Needed?: No    Activities of Daily Living In your present state of health, do you have any difficulty performing the following activities: 10/12/2020  Hearing? N  Vision? N  Difficulty concentrating or making decisions? N  Walking or climbing stairs? N  Dressing or bathing? N  Doing errands, shopping? N  Preparing Food and eating ? N  Using the Toilet? N  In the past six months, have you accidently leaked urine? N  Do you have problems with loss of bowel control? N  Managing your Medications? N  Managing your Finances? N  Housekeeping or managing your Housekeeping? N  Some recent data might be hidden    Patient Care Team: Burnard Hawthorne, FNP as PCP - General (Family Medicine)  Indicate any recent Medical Services you may have received from other than [facility] providers in the past year (date may be approximate).     Assessment:   This is a routine wellness examination for [place].  Hearing/Vision screen  Hearing Screening   125Hz  250Hz  500Hz   1000Hz  2000Hz  3000Hz  4000Hz  6000Hz  8000Hz   Right ear:           Left ear:           Comments: Patient is able to hear conversational tones without difficulty.  No issues reported.  Vision Screening Comments: Cataract extraction, bilateral  Visual acuity not assessed per patient preference since they have regular follow up with the ophthalmologist  Dietary issues and exercise activities discussed: Current Exercise Habits: Structured exercise class, Type of exercise: calisthenics (pilates), Time (Minutes): 30, Frequency (Times/Week): 2, Weekly Exercise (Minutes/Week): 60, Intensity: Mild  Healthy diet Good water intake  Goals  Addressed            This Visit's Progress   . COMPLETED: Healthy Lifestyle       Low carb foods Stay active and continue walking for exercise Stay hydrated and drink plenty of fluids    . Maintain Healthy Lifestyle       Stay hydrated Exercise Healthy diet      Depression Screen PHQ 2/9 Scores 10/12/2020 12/09/2019 01/30/2019 11/05/2018 10/29/2017 10/26/2016 10/29/2013  PHQ - 2 Score 0 0 0 0 0 0 0  PHQ- 9 Score - - 0 1 - - -    Fall Risk Fall Risk  10/12/2020 12/09/2019 01/30/2019 10/29/2017 10/26/2016  Falls in the past year? 0 0 0 No No  Number falls in past yr: 0 0 0 - -  Injury with Fall? 0 0 0 - -  Follow up Falls evaluation completed Falls evaluation completed Falls evaluation completed - -    FALL RISK PREVENTION PERTAINING TO THE HOME: Handrails in use when climbing stairs? Yes Home free of loose throw rugs in walkways, pet beds, electrical cords, etc? Yes  Adequate lighting in your home to reduce risk of falls? Yes   ASSISTIVE DEVICES UTILIZED TO PREVENT FALLS: Life alert? No  Use of a cane, walker or w/c? No   TIMED UP AND GO: Was the test performed? Yes .  Length of time to ambulate 10 feet: 10 sec.   Gait steady and fast without use of assistive device  Cognitive Function: Patient is alert and oriented x3.  Denies difficulty focusing,  making decisions, memory loss.  Enjoys completing crossword puzzles, brain games on ipad and other brain health activity.  MMSE/6CIT deferred. Normal by direct communication/observation.   MMSE - Mini Mental State Exam 10/29/2017 10/26/2016  Orientation to time 5 5  Orientation to Place 5 5  Registration 3 3  Attention/ Calculation 5 5  Recall 3 3  Language- name 2 objects 2 2  Language- repeat 1 1  Language- follow 3 step command 3 3  Language- read & follow direction 1 1  Write a sentence 1 1  Copy design 1 1  Total score 30 30        Immunizations Immunization History  Administered Date(s) Administered  . Influenza, High Dose Seasonal PF 07/05/2016  . Influenza,inj,Quad PF,6+ Mos 03/25/2014, 01/30/2019, 06/10/2020  . Pneumococcal Conjugate-13 10/29/2013  . Pneumococcal Polysaccharide-23 11/23/2015   TDAP status: Due, Education has been provided regarding the importance of this vaccine. Advised may receive this vaccine at local pharmacy or Health Dept. Aware to provide a copy of the vaccination record if obtained from local pharmacy or Health Dept. Verbalized acceptance and understanding. Deferred.   Covid vaccines- 2 plus 1 booster complete. Agrees to update immunization record.   Health Maintenance Health Maintenance  Topic Date Due  . COVID-19 Vaccine (1) Never done  . Zoster Vaccines- Shingrix (1 of 2) 01/12/2021 (Originally 08/04/1992)  . TETANUS/TDAP  10/12/2021 (Originally 08[birth year]04/1961)  . INFLUENZA VACCINE  12/12/2020  . DEXA SCAN  Completed  . PNA vac Low Risk Adult  Completed  . HPV VACCINES  Aged Out  . Hepatitis C Screening  Discontinued   Colorectal cancer screening: No longer required.   Mammogram status: Completed 01/04/20. Repeat every year  Bone Density status: Completed 01/04/20. Results reflect: Bone density results: NORMAL. Repeat every 5 years.  Lung Cancer Screening: (Low Dose CT Chest recommended if Age 30-80 years, 30 pack-year currently smoking OR  have quit  w/in 15years.) does not qualify.    Vision Screening: Recommended annual ophthalmology exams for early detection of glaucoma and other disorders of the eye. Is the patient up to date with their annual eye exam?  Yes   Dental Screening: Recommended annual dental exams for proper oral hygiene.  Community Resource Referral / Chronic Care Management: CRR required this visit?  No   CCM required this visit?  No      Plan:   I have personally reviewed and noted the following in the patient's chart:   . Medical and social history . Use of alcohol, tobacco or illicit drugs  . Current medications and supplements including opioid prescriptions.  . Functional ability and status . Nutritional status . Physical activity . Advanced directives . List of other physicians . Hospitalizations, surgeries, and ER visits in previous 12 months . Vitals . Screenings to include cognitive, depression, and falls . Referrals and appointments  In addition, I have reviewed and discussed with patient certain preventive protocols, quality metrics, and best practice recommendations. A written personalized care plan for preventive services as well as general preventive health recommendations were provided to patient via mychart.     Varney Biles, LPN   09/13/8411      Agree with plan. Mable Paris, NP

## 2020-10-29 ENCOUNTER — Other Ambulatory Visit: Payer: Self-pay | Admitting: Family

## 2021-02-23 ENCOUNTER — Other Ambulatory Visit: Payer: Self-pay | Admitting: Family

## 2021-02-23 DIAGNOSIS — I10 Essential (primary) hypertension: Secondary | ICD-10-CM

## 2021-05-16 ENCOUNTER — Other Ambulatory Visit: Payer: Self-pay | Admitting: Family

## 2021-05-16 DIAGNOSIS — I10 Essential (primary) hypertension: Secondary | ICD-10-CM

## 2021-06-13 ENCOUNTER — Other Ambulatory Visit: Payer: Self-pay | Admitting: Family

## 2021-06-13 DIAGNOSIS — Z1231 Encounter for screening mammogram for malignant neoplasm of breast: Secondary | ICD-10-CM

## 2021-06-28 ENCOUNTER — Ambulatory Visit: Payer: Medicare Other | Admitting: Podiatry

## 2021-06-28 ENCOUNTER — Ambulatory Visit (INDEPENDENT_AMBULATORY_CARE_PROVIDER_SITE_OTHER): Payer: Medicare Other

## 2021-06-28 ENCOUNTER — Other Ambulatory Visit: Payer: Self-pay

## 2021-06-28 ENCOUNTER — Encounter: Payer: Self-pay | Admitting: Podiatry

## 2021-06-28 DIAGNOSIS — M778 Other enthesopathies, not elsewhere classified: Secondary | ICD-10-CM

## 2021-06-28 MED ORDER — TRIAMCINOLONE ACETONIDE 40 MG/ML IJ SUSP
20.0000 mg | Freq: Once | INTRAMUSCULAR | Status: AC
  Administered 2021-06-28: 20 mg

## 2021-06-28 NOTE — Progress Notes (Signed)
°  Subjective:  Patient ID: Hannah Lee, female    DOB: 03-09-1941,  MRN: [id number] HPI Chief Complaint  Patient presents with   Foot Pain    Dorsal midfoot left - aching x 2-3 weeks, no injury, worse with increased walking, takes aleve prn   New Patient (Initial Visit)    Est pt 2016    81 y.o. female presents with the above complaint.   ROS: Denies fever chills nausea body muscle aches pains calf pain back pain chest pain shortness of breath.  Past Medical History:  Diagnosis Date   Allergy    Seasonal / Environmental - trees, pollen, dust   Arthritis    Chicken pox    Colon polyp 2011   Colonoscopy   Hyperlipidemia    Hypertension    Kidney stones    Told had through test   Past Surgical History:  Procedure Laterality Date   COLONOSCOPY WITH PROPOFOL N/A 12/06/2016   Procedure: COLONOSCOPY WITH PROPOFOL;  Surgeon: [name], MD;  [facility];  Service: Endoscopy;  Laterality: N/A;   TONSILLECTOMY AND ADENOIDECTOMY  1951    Current Outpatient Medications:    lisinopril (ZESTRIL) 10 MG tablet, TAKE 1 TABLET BY MOUTH  DAILY, Disp: 90 tablet, Rfl: 0   rosuvastatin (CRESTOR) 40 MG tablet, TAKE 1 TABLET BY MOUTH  DAILY, Disp: 90 tablet, Rfl: 3  Allergies  Allergen Reactions   Influenza Vaccines Rash    Cannot tolerate high dose. Likes regular dose.   Review of Systems Objective:  There were no vitals filed for this visit.  General: Well developed, nourished, in no acute distress, alert and oriented x3   Dermatological: Skin is warm, dry and supple bilateral. Nails x 10 are well maintained; remaining integument appears unremarkable at this time. There are no open sores, no preulcerative lesions, no rash or signs of infection present.  Vascular: Dorsalis Pedis artery and Posterior Tibial artery pedal pulses are 2/4 bilateral with immedate capillary fill time. Pedal hair growth present. No varicosities and no lower extremity edema present bilateral.    Neruologic: Grossly intact via light touch bilateral. Vibratory intact via tuning fork bilateral. Protective threshold with Semmes Wienstein monofilament intact to all pedal sites bilateral. Patellar and Achilles deep tendon reflexes 2+ bilateral. No Babinski or clonus noted bilateral.   Musculoskeletal: No gross boney pedal deformities bilateral. No pain, crepitus, or limitation noted with foot and ankle range of motion bilateral. Muscular strength 5/5 in all groups tested bilateral.  Pain on palpation dorsal aspect of the foot overlying the tarsometatarsal joints in particular the deep peroneal nerve left foot.  Gait: Unassisted, Nonantalgic.    Radiographs:  Radiographs taken today demonstrate significant osteoarthritis of the tarsometatarsal joints #1 2 and 3.  Dorsal spurring is noted joint space narrowing is noted subchondral sclerosis with eburnation.  Assessment & Plan:   Assessment: Osteoarthritis with neuritis.  Capsulitis dorsal aspect left foot  Plan: Injected across the dorsal aspect of the foot today.  Tolerated procedure well with the injection of 10 mg Kenalog 5 mg Marcaine.  Discussed appropriate shoe gear stretching exercise ice therapy sugar modifications lacing modifications and topical analgesics and anti-inflammatories.     Karrah Mangini T. [place], [place]

## 2021-07-17 ENCOUNTER — Ambulatory Visit
Admission: RE | Admit: 2021-07-17 | Discharge: 2021-07-17 | Disposition: A | Discharge status: Home / Self Care | Payer: Medicare Other | Source: Ambulatory Visit | Attending: Family | Admitting: Family

## 2021-07-17 ENCOUNTER — Other Ambulatory Visit: Payer: Self-pay

## 2021-07-17 DIAGNOSIS — Z1231 Encounter for screening mammogram for malignant neoplasm of breast: Secondary | ICD-10-CM | POA: Diagnosis not present

## 2021-08-09 ENCOUNTER — Other Ambulatory Visit: Payer: Self-pay | Admitting: Family

## 2021-08-09 DIAGNOSIS — I10 Essential (primary) hypertension: Secondary | ICD-10-CM

## 2021-09-30 ENCOUNTER — Other Ambulatory Visit: Payer: Self-pay | Admitting: Family

## 2021-10-13 ENCOUNTER — Ambulatory Visit: Payer: Medicare Other

## 2021-10-13 ENCOUNTER — Ambulatory Visit (INDEPENDENT_AMBULATORY_CARE_PROVIDER_SITE_OTHER): Payer: Medicare Other

## 2021-10-13 VITALS — Ht 64.99 in | Wt 191.0 lb

## 2021-10-13 DIAGNOSIS — Z0000 Encounter for general adult medical examination without abnormal findings: Secondary | ICD-10-CM | POA: Diagnosis not present

## 2021-10-13 NOTE — Progress Notes (Signed)
Subjective:   Hannah Lee is a 81 y.o. female who presents for Medicare Annual (Subsequent) preventive examination.  Review of Systems    No ROS.  Medicare Wellness Virtual Visit.  Visual/audio telehealth visit, UTA vital signs.   See social history for additional risk factors.   Cardiac Risk Factors include: advanced age (>70mn, >>37women)     Objective:    Today's Vitals   10/13/21 1135  Weight: 191 lb (86.6 kg)  Height: 5' 4.99" (1.651 m)   Body mass index is 31.79 kg/m.     10/13/2021   11:42 AM 10/12/2020   12:42 PM 10/29/2017    8:32 AM 12/06/2016    8:48 AM 10/26/2016    8:27 AM  Advanced Directives  Does Patient Have a Medical Advance Directive? Yes Yes Yes Yes Yes  Type of AParamedicof [place]Steamboat SpringsLiving will [place]Living will Healthcare Power of [place]Living will  Does patient want to make changes to medical advance directive? No - Patient declined No - Patient declined No - Patient declined  No - Patient declined  Copy of [place] Chart? No - copy requested No - copy requested No - copy requested No - copy requested No - copy requested    Current Medications (verified) Outpatient Encounter Medications as of 10/13/2021  Medication Sig   lisinopril (ZESTRIL) 10 MG tablet TAKE 1 TABLET BY MOUTH DAILY   rosuvastatin (CRESTOR) 40 MG tablet TAKE 1 TABLET BY MOUTH  DAILY   No facility-administered encounter medications on file as of 10/13/2021.    Allergies (verified) Influenza vaccines   History: Past Medical History:  Diagnosis Date   Allergy    Seasonal / Environmental - trees, pollen, dust   Arthritis    Chicken pox    Colon polyp 2011   Colonoscopy   Hyperlipidemia    Hypertension    Kidney stones    Told had through test   Past Surgical History:  Procedure Laterality Date   COLONOSCOPY WITH PROPOFOL N/A 12/06/2016    Procedure: COLONOSCOPY WITH PROPOFOL;  Surgeon: AJonathon Bellows MD;  [facility];  Service: Endoscopy;  Laterality: N/A;   TONSILLECTOMY AND ADENOIDECTOMY  1951   Family History  Problem Relation Age of Onset   Hyperlipidemia Father    Hypertension Father    Stroke Father    Colon cancer Sister        colon cancer   Alzheimer's disease Sister    Aortic aneurysm Brother 434      Ruptured Aneurysm   Cancer Mother        Gall bladder   Heart disease Paternal Grandmother    Heart disease Paternal Grandfather    Cancer Daughter 419      died of leukemia   Aortic aneurysm Son    Social History   Socioeconomic History   Marital status: Married    Spouse name: Hannah Lee   Number of children: 2   Years of education: 12   Highest education level: Not on file  Occupational History   Occupation: Retired    Comment: Hairdresser  Tobacco Use   Smoking status: Former    Years: 2.00    Types: Cigarettes    Quit date: 10/30/1963    Years since quitting: 57.9   Smokeless tobacco: Never  Vaping Use   Vaping Use: Never used  Substance and Sexual Activity   Alcohol  use: Yes    Alcohol/week: 7.0 standard drinks    Types: 7 Standard drinks or equivalent per week    Comment: Wine - about a glass or two on weekends   Drug use: No   Sexual activity: Never  Other Topics Concern   Not on file  Social History Narrative   Born and raised in [place], [place]. Retirement brought down to [place] to escape the cold. Retired Theme park manager. Has recently moved, gardening, sewing. Working out every day. Walking, pilates, lifting weights at home. Married (Hannah Lee) with two children. Daughter died age 30 from leukemia. Son Hannah Lee) lives in [place]. They have a granddaughter Hannah Lee) who is attending [employer].   Social Determinants of Radio broadcast assistant Strain: Not on file  Food Insecurity: No Food Insecurity   Worried About Charity fundraiser in the Last Year: Never true   Academic librarian in the Last Year: Never true  Transportation Needs: Not on file  Physical Activity: Not on file  Stress: Not on file  Social Connections: Not on file   Tobacco Counseling Counseling given: Not Answered  Clinical Intake: Pre-visit preparation completed: Yes        Diabetes: No  How often do you need to have someone help you when you read instructions, pamphlets, or other written materials from your doctor or pharmacy?: 1 - Never   Interpreter Needed?: No    Activities of Daily Living    10/13/2021   11:44 AM  In your present state of health, do you have any difficulty performing the following activities:  Hearing? 0  Vision? 0  Difficulty concentrating or making decisions? 0  Walking or climbing stairs? 0  Dressing or bathing? 0  Doing errands, shopping? 0  Preparing Food and eating ? N  Using the Toilet? N  In the past six months, have you accidently leaked urine? N  Do you have problems with loss of bowel control? N  Managing your Medications? N  Managing your Finances? N  Housekeeping or managing your Housekeeping? N    Patient Care Team: Burnard Hawthorne, FNP as PCP - General (Family Medicine)  Indicate any recent Medical Services you may have received from other than [facility] providers in the past year (date may be approximate).     Assessment:   This is a routine wellness examination for Hannah Lee.  Virtual Visit via Telephone Note  I connected with  Hannah Lee on 10/13/21 at 11:30 AM EDT by telephone and verified that I am speaking with the correct person using two identifiers.  Persons participating in the virtual visit: patient/Nurse Health Advisor   I discussed the limitations of performing an evaluation and management service by telehealth.  We continued and completed visit with audio only. Some vital signs may be absent or patient reported.   Hearing/Vision screen Hearing Screening - Comments:: Patient is able to hear conversational tones  without difficulty. No issues reported. Vision Screening - Comments:: Wears readers as needed Cataract extraction, bilateral  They have regular follow up with the ophthalmologist  Dietary issues and exercise activities discussed: Current Exercise Habits: Home exercise routine, Intensity: Mild Healthy diet   Goals Addressed             This Visit's Progress    Maintain Healthy Lifestyle       Stay hydrated. Exercise. Healthy diet.       Depression Screen    10/13/2021   11:39 AM 10/12/2020   12:28  PM 12/09/2019   11:06 AM 01/30/2019    8:10 AM 11/05/2018    8:39 AM 10/29/2017    8:33 AM 10/26/2016    8:34 AM  PHQ 2/9 Scores  PHQ - 2 Score 0 0 0 0 0 0 0  PHQ- 9 Score    0 1      Fall Risk    10/13/2021   11:43 AM 10/12/2020   12:29 PM 12/09/2019   11:06 AM 01/30/2019    8:10 AM 10/29/2017    8:33 AM  Fall Risk   Falls in the past year? 0 0 0 0 No  Number falls in past yr: 0 0 0 0   Injury with Fall?  0 0 0   Follow up Falls evaluation completed Falls evaluation completed Falls evaluation completed Falls evaluation completed     FALL RISK PREVENTION PERTAINING TO THE HOME: Home free of loose throw rugs in walkways, pet beds, electrical cords, etc? Yes  Adequate lighting in your home to reduce risk of falls? Yes   ASSISTIVE DEVICES UTILIZED TO PREVENT FALLS: Life alert? No  Use of a cane, walker or w/c? No   TIMED UP AND GO: Was the test performed? No .   Cognitive Function: Patient is alert and oriented x3.     10/29/2017    8:39 AM 10/26/2016    8:35 AM  MMSE - Mini Mental State Exam  Orientation to time 5 5  Orientation to Place 5 5  Registration 3 3  Attention/ Calculation 5 5  Recall 3 3  Language- name 2 objects 2 2  Language- repeat 1 1  Language- follow 3 step command 3 3  Language- read & follow direction 1 1  Write a sentence 1 1  Copy design 1 1  Total score 30 30        Immunizations Immunization History  Administered Date(s) Administered    Influenza, Lee Dose Seasonal PF 07/05/2016   Influenza,inj,Quad PF,6+ Mos 03/25/2014, 01/30/2019, 06/10/2020   Pneumococcal Conjugate-13 10/29/2013   Pneumococcal Polysaccharide-23 11/23/2015   TDAP status: Due, Education has been provided regarding the importance of this vaccine. Advised may receive this vaccine at local pharmacy or Health Dept. Aware to provide a copy of the vaccination record if obtained from local pharmacy or Health Dept. Verbalized acceptance and understanding.  Shingrix Completed?: No.    Education has been provided regarding the importance of this vaccine. Patient has been advised to call insurance company to determine out of pocket expense if they have not yet received this vaccine. Advised may also receive vaccine at local pharmacy or Health Dept. Verbalized acceptance and understanding.  Screening Tests Health Maintenance  Topic Date Due   TETANUS/TDAP  11/28/2021 (Originally 08/04/1959)   COVID-19 Vaccine (1) 11/28/2021 (Originally 02/05/1941)   Zoster Vaccines- Shingrix (1 of 2) 11/28/2021 (Originally 08/04/1959)   INFLUENZA VACCINE  12/12/2021   Pneumonia Vaccine 66+ Years old  Completed   DEXA SCAN  Completed   HPV VACCINES  Aged Out   COLONOSCOPY (Pts 45-79yr Insurance coverage will need to be confirmed)  Discontinued   Hepatitis C Screening  Discontinued   Health Maintenance There are no preventive care reminders to display for this patient.  Lung Cancer Screening: (Low Dose CT Chest recommended if Age 79-80years, 30 pack-year currently smoking OR have quit w/in 15years.) does not qualify.   Vision Screening: Recommended annual ophthalmology exams for early detection of glaucoma and other disorders of the eye.  Dental  Screening: Recommended annual dental exams for proper oral hygiene  Community Resource Referral / Chronic Care Management: CRR required this visit?  No   CCM required this visit?  No      Plan:   Keep all routine maintenance  appointments.   I have personally reviewed and noted the following in the patient's chart:   Medical and social history Use of alcohol, tobacco or illicit drugs  Current medications and supplements including opioid prescriptions.  Functional ability and status Nutritional status Physical activity Advanced directives List of other physicians Hospitalizations, surgeries, and ER visits in previous 12 months Vitals Screenings to include cognitive, depression, and falls Referrals and appointments  In addition, I have reviewed and discussed with patient certain preventive protocols, quality metrics, and best practice recommendations. A written personalized care plan for preventive services as well as general preventive health recommendations were provided to patient.     Varney Biles, LPN   05/14/2160

## 2021-10-13 NOTE — Patient Instructions (Addendum)
  Hannah Lee , Thank you for taking time to come for your Medicare Wellness Visit. I appreciate your ongoing commitment to your health goals. Please review the following plan we discussed and let me know if I can assist you in the future.   These are the goals we discussed:  Goals      Maintain Healthy Lifestyle     Stay hydrated. Exercise. Healthy diet.        This is a list of the screening recommended for you and due dates:  Health Maintenance  Topic Date Due   Tetanus Vaccine  11/28/2021*   COVID-19 Vaccine (1) 11/28/2021*   Zoster (Shingles) Vaccine (1 of 2) 11/28/2021*   Flu Shot  12/12/2021   Pneumonia Vaccine  Completed   DEXA scan (bone density measurement)  Completed   HPV Vaccine  Aged Out   Colon Cancer Screening  Discontinued   Hepatitis C Screening: USPSTF Recommendation to screen - Ages 51-79 yo.  Discontinued  *Topic was postponed. The date shown is not the original due date.

## 2021-11-28 ENCOUNTER — Other Ambulatory Visit: Payer: Self-pay

## 2021-11-28 DIAGNOSIS — Z0001 Encounter for general adult medical examination with abnormal findings: Secondary | ICD-10-CM

## 2021-11-28 NOTE — Progress Notes (Signed)
Labs ordered.

## 2021-11-29 ENCOUNTER — Encounter: Payer: Self-pay | Admitting: Family

## 2021-11-29 ENCOUNTER — Ambulatory Visit (INDEPENDENT_AMBULATORY_CARE_PROVIDER_SITE_OTHER): Payer: Medicare Other | Admitting: Family

## 2021-11-29 VITALS — BP 128/80 | HR 90 | Temp 97.9°F | Ht 65.0 in | Wt 189.2 lb

## 2021-11-29 DIAGNOSIS — E785 Hyperlipidemia, unspecified: Secondary | ICD-10-CM

## 2021-11-29 DIAGNOSIS — Z0000 Encounter for general adult medical examination without abnormal findings: Secondary | ICD-10-CM

## 2021-11-29 DIAGNOSIS — Z1231 Encounter for screening mammogram for malignant neoplasm of breast: Secondary | ICD-10-CM

## 2021-11-29 DIAGNOSIS — M8580 Other specified disorders of bone density and structure, unspecified site: Secondary | ICD-10-CM | POA: Diagnosis not present

## 2021-11-29 DIAGNOSIS — I10 Essential (primary) hypertension: Secondary | ICD-10-CM

## 2021-11-29 DIAGNOSIS — Z0001 Encounter for general adult medical examination with abnormal findings: Secondary | ICD-10-CM

## 2021-11-29 LAB — CBC WITH DIFFERENTIAL/PLATELET
Basophils Absolute: 0 10*3/uL (ref 0.0–0.1)
Basophils Relative: 0.4 % (ref 0.0–3.0)
Eosinophils Absolute: 0.2 10*3/uL (ref 0.0–0.7)
Eosinophils Relative: 2.6 % (ref 0.0–5.0)
HCT: 40 % (ref 36.0–46.0)
Hemoglobin: 13.1 g/dL (ref 12.0–15.0)
Lymphocytes Relative: 28.4 % (ref 12.0–46.0)
Lymphs Abs: 1.8 10*3/uL (ref 0.7–4.0)
MCHC: 32.8 g/dL (ref 30.0–36.0)
MCV: 92 fl (ref 78.0–100.0)
Monocytes Absolute: 0.5 10*3/uL (ref 0.1–1.0)
Monocytes Relative: 7.5 % (ref 3.0–12.0)
Neutro Abs: 3.8 10*3/uL (ref 1.4–7.7)
Neutrophils Relative %: 61.1 % (ref 43.0–77.0)
Platelets: 164 10*3/uL (ref 150.0–400.0)
RBC: 4.35 Mil/uL (ref 3.87–5.11)
RDW: 13.9 % (ref 11.5–15.5)
WBC: 6.2 10*3/uL (ref 4.0–10.5)

## 2021-11-29 LAB — COMPREHENSIVE METABOLIC PANEL
ALT: 18 U/L (ref 0–35)
AST: 20 U/L (ref 0–37)
Albumin: 4.7 g/dL (ref 3.5–5.2)
Alkaline Phosphatase: 57 U/L (ref 39–117)
BUN: 18 mg/dL (ref 6–23)
CO2: 26 mEq/L (ref 19–32)
Calcium: 10.1 mg/dL (ref 8.4–10.5)
Chloride: 100 mEq/L (ref 96–112)
Creatinine, Ser: 1.25 mg/dL — ABNORMAL HIGH (ref 0.40–1.20)
GFR: 41.05 mL/min — ABNORMAL LOW (ref >60.00)
Glucose, Bld: 99 mg/dL (ref 70–99)
Potassium: 4.2 mEq/L (ref 3.5–5.1)
Sodium: 138 mEq/L (ref 135–145)
Total Bilirubin: 0.7 mg/dL (ref 0.2–1.2)
Total Protein: 7.2 g/dL (ref 6.0–8.3)

## 2021-11-29 LAB — VITAMIN D 25 HYDROXY (VIT D DEFICIENCY, FRACTURES): VITD: 54.16 ng/mL (ref 30.00–100.00)

## 2021-11-29 LAB — MICROALBUMIN / CREATININE URINE RATIO
Creatinine,U: 38 mg/dL
Microalb Creat Ratio: 31.9 mg/g — ABNORMAL HIGH (ref 0.0–30.0)
Microalb, Ur: 12.1 mg/dL — ABNORMAL HIGH (ref 0.0–1.9)

## 2021-11-29 LAB — POCT GLYCOSYLATED HEMOGLOBIN (HGB A1C): Hemoglobin A1C: 5.6 % (ref 4.0–5.6)

## 2021-11-29 LAB — TSH: TSH: 2.08 u[IU]/mL (ref 0.35–5.50)

## 2021-11-29 NOTE — Patient Instructions (Addendum)
  For post menopausal women, guidelines recommend a diet with 1200 mg of Calcium per day. If you are eating calcium rich foods, you do not need a calcium supplement. The body better absorbs the calcium that you eat over supplementation. If you do supplement, I recommend not supplementing the full 1200 mg/ day as this can lead to increased risk of cardiovascular disease. I recommend Calcium Citrate over the counter, and you may take a total of 600 to 800 mg per day in divided doses with meals for best absorption.   For bone health, you need adequate vitamin D, and I recommend you supplement as it is harder to do so with diet alone. I recommend cholecalciferol 800 units daily.  Also, please ensure you are following a diet high in calcium -- research shows better outcomes with dietary sources including kale, yogurt, broccolii, cheese, okra, almonds- to name a few.     Also remember that exercise is a great medicine for maintain and preserve bone health. Advise moderate exercise for 30 minutes , 3 times per week.    Please call  and schedule your 3D mammogram and /or bone density scan as we discussed.   [facility]  ( new location in 2023)  [address], [place], [place] [address]  [place], [place]

## 2021-11-29 NOTE — Assessment & Plan Note (Signed)
Control.  Continue lisinopril 10 mg

## 2021-11-29 NOTE — Progress Notes (Signed)
Subjective:    Patient ID: Hannah Lee, female    DOB: [birth year], 80 y.o.   MRN: [id number]  CC: Hannah Lee is a 81 y.o. female who presents today for follow up.   HPI: Feels well today.  No new complaints.  She walks on the treadmill for exercise.   HTN-compliant with lisinopril 10 mg. No cp Hyperlipidemia-compliant with Crestor 40 mg  History of prediabetes   HISTORY:  Past Medical History:  Diagnosis Date   Allergy    Seasonal / Environmental - trees, pollen, dust   Arthritis    Chicken pox    Colon polyp 2011   Colonoscopy   Hyperlipidemia    Hypertension    Kidney stones    Told had through test   Past Surgical History:  Procedure Laterality Date   COLONOSCOPY WITH PROPOFOL N/A 12/06/2016   Procedure: COLONOSCOPY WITH PROPOFOL;  Surgeon: [name], MD;  [facility];  Service: Endoscopy;  Laterality: N/A;   TONSILLECTOMY AND ADENOIDECTOMY  1951   Family History  Problem Relation Age of Onset   Hyperlipidemia Father    Hypertension Father    Stroke Father    Colon cancer Sister        colon cancer   Alzheimer's disease Sister    Aortic aneurysm Brother 15       Ruptured Aneurysm   Cancer Mother        Gall bladder   Heart disease Paternal Grandmother    Heart disease Paternal Grandfather    Cancer Daughter 51       died of leukemia   Aortic aneurysm Son     Allergies: Influenza vaccines Current Outpatient Medications on File Prior to Visit  Medication Sig Dispense Refill   lisinopril (ZESTRIL) 10 MG tablet TAKE 1 TABLET BY MOUTH DAILY 90 tablet 3   rosuvastatin (CRESTOR) 40 MG tablet TAKE 1 TABLET BY MOUTH  DAILY 90 tablet 0   No current facility-administered medications on file prior to visit.    Social History   Tobacco Use   Smoking status: Former    Years: 2.00    Types: Cigarettes    Quit date: 10/30/1963    Years since quitting: 58.1   Smokeless tobacco: Never  Vaping Use   Vaping Use: Never used  Substance Use  Topics   Alcohol use: Yes    Alcohol/week: 7.0 standard drinks of alcohol    Types: 7 Standard drinks or equivalent per week    Comment: Wine - about a glass or two on weekends   Drug use: No    Review of Systems  Constitutional:  Negative for chills and fever.  Respiratory:  Negative for cough.   Cardiovascular:  Negative for chest pain and palpitations.  Gastrointestinal:  Negative for nausea and vomiting.      Objective:    BP 128/80 (BP Location: Left Arm, Patient Position: Sitting, Cuff Size: Normal)   Pulse 90   Temp 97.9 F (36.6 C) (Oral)   Ht '5\' 5"'$  (1.651 m)   Wt 189 lb 3.2 oz (85.8 kg)   SpO2 99%   BMI 31.48 kg/m  BP Readings from Last 3 Encounters:  11/29/21 128/80  10/12/20 (!) 152/88  06/10/20 110/72   Wt Readings from Last 3 Encounters:  11/29/21 189 lb 3.2 oz (85.8 kg)  10/13/21 191 lb (86.6 kg)  10/12/20 191 lb 3.2 oz (86.7 kg)    Physical Exam Vitals reviewed.  Constitutional:  Appearance: She is well-developed.  Eyes:     Conjunctiva/sclera: Conjunctivae normal.  Cardiovascular:     Rate and Rhythm: Normal rate and regular rhythm.     Pulses: Normal pulses.     Heart sounds: Normal heart sounds.  Pulmonary:     Effort: Pulmonary effort is normal.     Breath sounds: Normal breath sounds. No wheezing, rhonchi or rales.  Skin:    General: Skin is warm and dry.  Neurological:     Mental Status: She is alert.  Psychiatric:        Speech: Speech normal.        Behavior: Behavior normal.        Thought Content: Thought content normal.        Assessment & Plan:   Problem List Items Addressed This Visit       Cardiovascular and Mediastinum   Essential hypertension - Primary    Control.  Continue lisinopril 10 mg      Relevant Orders   POCT HgB A1C (Completed)   Microalbumin / creatinine urine ratio     Musculoskeletal and Integument   Osteopenia    Discussed optimizing calcium and vitamin D.  Patient will schedule bone DEXA  with mammogram in 2024.      Relevant Orders   DG Bone Density     Other   Encounter for preventative adult health care exam with abnormal findings   HLD (hyperlipidemia)    Pending lipid panel.  Continue Crestor '40mg'$       Screening for breast cancer   Relevant Orders   MM 3D SCREEN BREAST BILATERAL     I am having Hannah Lee maintain her lisinopril and rosuvastatin.   No orders of the defined types were placed in this encounter.   Return precautions given.   Risks, benefits, and alternatives of the medications and treatment plan prescribed today were discussed, and patient expressed understanding.   Education regarding symptom management and diagnosis given to patient on AVS.  Continue to follow with Burnard Hawthorne, FNP for routine health maintenance.   Hannah Lee and I agreed with plan.   Mable Paris, FNP

## 2021-11-29 NOTE — Assessment & Plan Note (Signed)
Pending lipid panel.  Continue Crestor '40mg'$ 

## 2021-11-29 NOTE — Assessment & Plan Note (Signed)
Discussed optimizing calcium and vitamin D.  Patient will schedule bone DEXA with mammogram in 2024.

## 2021-12-01 ENCOUNTER — Other Ambulatory Visit: Payer: Self-pay | Admitting: Family

## 2021-12-01 DIAGNOSIS — R809 Proteinuria, unspecified: Secondary | ICD-10-CM

## 2021-12-05 ENCOUNTER — Telehealth: Payer: Self-pay

## 2021-12-05 NOTE — Telephone Encounter (Signed)
Pt returning call to Sara.

## 2021-12-05 NOTE — Telephone Encounter (Signed)
LMTCB for lab results & patient needs lab appointment in next few weeks.

## 2021-12-05 NOTE — Telephone Encounter (Signed)
See result note.  

## 2021-12-08 ENCOUNTER — Other Ambulatory Visit (INDEPENDENT_AMBULATORY_CARE_PROVIDER_SITE_OTHER): Payer: Medicare Other

## 2021-12-08 DIAGNOSIS — Z0001 Encounter for general adult medical examination with abnormal findings: Secondary | ICD-10-CM | POA: Diagnosis not present

## 2021-12-08 DIAGNOSIS — R809 Proteinuria, unspecified: Secondary | ICD-10-CM

## 2021-12-08 LAB — HEMOGLOBIN A1C: Hgb A1c MFr Bld: 6.2 % (ref 4.6–6.5)

## 2021-12-12 LAB — MULTIPLE MYELOMA PANEL, SERUM
Albumin SerPl Elph-Mcnc: 4.3 g/dL (ref 2.9–4.4)
Albumin/Glob SerPl: 1.4 (ref 0.7–1.7)
Alpha 1: 0.3 g/dL (ref 0.0–0.4)
Alpha2 Glob SerPl Elph-Mcnc: 0.9 g/dL (ref 0.4–1.0)
B-Globulin SerPl Elph-Mcnc: 1.3 g/dL (ref 0.7–1.3)
Gamma Glob SerPl Elph-Mcnc: 0.7 g/dL (ref 0.4–1.8)
Globulin, Total: 3.1 g/dL (ref 2.2–3.9)
IgA/Immunoglobulin A, Serum: 464 mg/dL — ABNORMAL HIGH (ref 64–422)
IgG (Immunoglobin G), Serum: 630 mg/dL (ref 586–1602)
IgM (Immunoglobulin M), Srm: 207 mg/dL (ref 26–217)
Total Protein: 7.4 g/dL (ref 6.0–8.5)

## 2021-12-12 LAB — IFE AND PE, RANDOM URINE
% BETA, Urine: 20.9 %
ALBUMIN, U: 25.4 %
ALPHA 1 URINE: 8.2 %
ALPHA-2-GLOBULIN, U: 31.3 %
GAMMA GLOBULIN URINE: 14.1 %
Protein, Ur: 70.6 mg/dL

## 2021-12-15 ENCOUNTER — Other Ambulatory Visit: Payer: Self-pay | Admitting: Family

## 2021-12-15 DIAGNOSIS — R809 Proteinuria, unspecified: Secondary | ICD-10-CM

## 2021-12-21 ENCOUNTER — Other Ambulatory Visit: Payer: Self-pay | Admitting: Internal Medicine

## 2021-12-22 ENCOUNTER — Other Ambulatory Visit: Payer: Self-pay | Admitting: Family

## 2021-12-22 DIAGNOSIS — R809 Proteinuria, unspecified: Secondary | ICD-10-CM

## 2021-12-22 DIAGNOSIS — I10 Essential (primary) hypertension: Secondary | ICD-10-CM

## 2021-12-22 MED ORDER — LISINOPRIL 20 MG PO TABS: 20.0000 mg | ORAL_TABLET | Freq: Every day | ORAL | 3 refills | Status: DC

## 2021-12-25 ENCOUNTER — Telehealth: Payer: Self-pay

## 2021-12-25 NOTE — Telephone Encounter (Signed)
LVM TO CALL BACK TO OFFICE TO GO OVER RESULTS

## 2021-12-26 NOTE — Telephone Encounter (Signed)
Patient returned Hannah Lee, CMA's call regarding results.  I spoke with Hannah and transferred the call to her.

## 2022-01-03 ENCOUNTER — Telehealth: Payer: Self-pay

## 2022-01-03 ENCOUNTER — Ambulatory Visit (INDEPENDENT_AMBULATORY_CARE_PROVIDER_SITE_OTHER): Payer: Medicare Other | Admitting: Family

## 2022-01-03 ENCOUNTER — Encounter: Payer: Self-pay | Admitting: Family

## 2022-01-03 VITALS — BP 122/66 | HR 69 | Temp 97.8°F | Ht 65.0 in | Wt 187.4 lb

## 2022-01-03 DIAGNOSIS — R809 Proteinuria, unspecified: Secondary | ICD-10-CM | POA: Diagnosis not present

## 2022-01-03 DIAGNOSIS — Z23 Encounter for immunization: Secondary | ICD-10-CM | POA: Diagnosis not present

## 2022-01-03 DIAGNOSIS — I10 Essential (primary) hypertension: Secondary | ICD-10-CM | POA: Diagnosis not present

## 2022-01-03 LAB — BASIC METABOLIC PANEL
BUN: 26 mg/dL — ABNORMAL HIGH (ref 6–23)
CO2: 23 mEq/L (ref 19–32)
Calcium: 9.7 mg/dL (ref 8.4–10.5)
Chloride: 104 mEq/L (ref 96–112)
Creatinine, Ser: 1.18 mg/dL (ref 0.40–1.20)
GFR: 43.96 mL/min — ABNORMAL LOW (ref >60.00)
Glucose, Bld: 95 mg/dL (ref 70–99)
Potassium: 4.1 mEq/L (ref 3.5–5.1)
Sodium: 137 mEq/L (ref 135–145)

## 2022-01-03 MED ORDER — LISINOPRIL 20 MG PO TABS: 20.0000 mg | ORAL_TABLET | Freq: Every day | ORAL | 3 refills | Status: DC

## 2022-01-03 NOTE — Assessment & Plan Note (Addendum)
Suboptimal control. She is tolerating lisinopril 20 mg.  We will recheck urine protein in 6 to 8 weeks.  She will maintain follow-up with nephrology.

## 2022-01-03 NOTE — Telephone Encounter (Signed)
Per patient request regular dose of annual flu vaccine given. Patient stated that she has reaction to high dose.

## 2022-01-03 NOTE — Patient Instructions (Addendum)
As discussed, please ensure you do make a follow-up with nephrology due to proteinuria.  Nice to see you!

## 2022-01-03 NOTE — Progress Notes (Signed)
Subjective:    Patient ID: Hannah Lee, female    DOB: 1940/10/19, 81 y.o.   MRN: [id number]  CC: Hannah Lee is a 81 y.o. female who presents today for HTN follow up.   HPI: Feels well today No new complaints     Proteinuria -compliant with lisinopril 20 mg , increased lisinopril from 42m.   patient follows with Dr. SCandiss Norse nephrology, follow up scheduled 01/11/22 HISTORY:  Past Medical History:  Diagnosis Date   Allergy    Seasonal / Environmental - trees, pollen, dust   Arthritis    Chicken pox    Colon polyp 2011   Colonoscopy   Hyperlipidemia    Hypertension    Kidney stones    Told had through test   Past Surgical History:  Procedure Laterality Date   COLONOSCOPY WITH PROPOFOL N/A 12/06/2016   Procedure: COLONOSCOPY WITH PROPOFOL;  Surgeon: AJonathon Bellows MD;  [facility];  Service: Endoscopy;  Laterality: N/A;   TONSILLECTOMY AND ADENOIDECTOMY  1951   Family History  Problem Relation Age of Onset   Hyperlipidemia Father    Hypertension Father    Stroke Father    Colon cancer Sister        colon cancer   Alzheimer's disease Sister    Aortic aneurysm Brother 429      Ruptured Aneurysm   Cancer Mother        Gall bladder   Heart disease Paternal Grandmother    Heart disease Paternal Grandfather    Cancer Daughter 456      died of leukemia   Aortic aneurysm Son     Allergies: Influenza vaccines Current Outpatient Medications on File Prior to Visit  Medication Sig Dispense Refill   rosuvastatin (CRESTOR) 40 MG tablet TAKE 1 TABLET BY MOUTH DAILY 90 tablet 3   No current facility-administered medications on file prior to visit.    Social History   Tobacco Use   Smoking status: Former    Years: 2.00    Types: Cigarettes    Quit date: 10/30/1963    Years since quitting: 58.2   Smokeless tobacco: Never  Vaping Use   Vaping Use: Never used  Substance Use Topics   Alcohol use: Yes    Alcohol/week: 7.0 standard drinks of alcohol     Types: 7 Standard drinks or equivalent per week    Comment: Wine - about a glass or two on weekends   Drug use: No    Review of Systems  Constitutional:  Negative for chills and fever.  Respiratory:  Negative for cough.   Cardiovascular:  Negative for chest pain and palpitations.  Gastrointestinal:  Negative for nausea and vomiting.      Objective:    BP 122/66 (BP Location: Left Arm, Patient Position: Sitting, Cuff Size: Normal)   Pulse 69   Temp 97.8 F (36.6 C) (Oral)   Ht _0  (1.651 m)   Wt 187 lb 6.4 oz (85 kg)   SpO2 94%   BMI 31.18 kg/m  BP Readings from Last 3 Encounters:  01/03/22 122/66  11/29/21 128/80  10/12/20 (!) 152/88   Wt Readings from Last 3 Encounters:  01/03/22 187 lb 6.4 oz (85 kg)  11/29/21 189 lb 3.2 oz (85.8 kg)  10/13/21 191 lb (86.6 kg)    Physical Exam Vitals reviewed.  Constitutional:      Appearance: She is well-developed.  Eyes:     Conjunctiva/sclera: Conjunctivae normal.  Cardiovascular:  Rate and Rhythm: Normal rate and regular rhythm.     Pulses: Normal pulses.     Heart sounds: Normal heart sounds.  Pulmonary:     Effort: Pulmonary effort is normal.     Breath sounds: Normal breath sounds. No wheezing, rhonchi or rales.  Skin:    General: Skin is warm and dry.  Neurological:     Mental Status: She is alert.  Psychiatric:        Speech: Speech normal.        Behavior: Behavior normal.        Thought Content: Thought content normal.        Assessment & Plan:   Problem List Items Addressed This Visit       Cardiovascular and Mediastinum   Essential hypertension   Relevant Medications   lisinopril (ZESTRIL) 20 MG tablet     Other   Microalbuminuria    Suboptimal control. She is tolerating lisinopril 20 mg.  We will recheck urine protein in 6 to 8 weeks.  She will maintain follow-up with nephrology.        Relevant Medications   lisinopril (ZESTRIL) 20 MG tablet   Other Relevant Orders   Microalbumin /  creatinine urine ratio   Multiple Myeloma Panel (SPEP&IFE w/QIG)   Other Visit Diagnoses     Need for immunization against influenza    -  Primary   Relevant Orders   Flu Vaccine QUAD 39moIM (Fluarix, Fluzone & Alfiuria Quad PF) (Completed)        I am having MTama Highmaintain her rosuvastatin and lisinopril.   Meds ordered this encounter  Medications   lisinopril (ZESTRIL) 20 MG tablet    Sig: Take 1 tablet (20 mg total) by mouth daily.    Dispense:  90 tablet    Refill:  3    Order Specific Question:   Supervising Provider    Answer:   TCrecencio Mc[[id number]]    Return precautions given.   Risks, benefits, and alternatives of the medications and treatment plan prescribed today were discussed, and patient expressed understanding.   Education regarding symptom management and diagnosis given to patient on AVS.  Continue to follow with ABurnard Hawthorne FNP for routine health maintenance.   MTama Highand I agreed with plan.   MMable Paris FNP

## 2022-02-14 ENCOUNTER — Other Ambulatory Visit (INDEPENDENT_AMBULATORY_CARE_PROVIDER_SITE_OTHER): Payer: Medicare Other

## 2022-02-14 DIAGNOSIS — R809 Proteinuria, unspecified: Secondary | ICD-10-CM | POA: Diagnosis not present

## 2022-02-14 LAB — MICROALBUMIN / CREATININE URINE RATIO
Creatinine,U: 57.8 mg/dL
Microalb Creat Ratio: 21.5 mg/g (ref 0.0–30.0)
Microalb, Ur: 12.4 mg/dL — ABNORMAL HIGH (ref 0.0–1.9)

## 2022-02-19 ENCOUNTER — Telehealth: Payer: Self-pay | Admitting: Family

## 2022-02-19 NOTE — Telephone Encounter (Signed)
Can you check on status of multiple myeloma panel obtained 02/14/22?

## 2022-02-20 LAB — MULTIPLE MYELOMA PANEL, SERUM
Albumin SerPl Elph-Mcnc: 4 g/dL (ref 2.9–4.4)
Albumin/Glob SerPl: 1.5 (ref 0.7–1.7)
Alpha 1: 0.2 g/dL (ref 0.0–0.4)
Alpha2 Glob SerPl Elph-Mcnc: 0.7 g/dL (ref 0.4–1.0)
B-Globulin SerPl Elph-Mcnc: 1.1 g/dL (ref 0.7–1.3)
Gamma Glob SerPl Elph-Mcnc: 0.7 g/dL (ref 0.4–1.8)
Globulin, Total: 2.7 g/dL (ref 2.2–3.9)
IgA/Immunoglobulin A, Serum: 444 mg/dL — ABNORMAL HIGH (ref 64–422)
IgG (Immunoglobin G), Serum: 586 mg/dL (ref 586–1602)
IgM (Immunoglobulin M), Srm: 191 mg/dL (ref 26–217)
Total Protein: 6.7 g/dL (ref 6.0–8.5)

## 2022-05-28 DIAGNOSIS — I129 Hypertensive chronic kidney disease with stage 1 through stage 4 chronic kidney disease, or unspecified chronic kidney disease: Secondary | ICD-10-CM | POA: Insufficient documentation

## 2022-05-28 DIAGNOSIS — N1832 Chronic kidney disease, stage 3b: Secondary | ICD-10-CM | POA: Insufficient documentation

## 2022-05-28 DIAGNOSIS — I10 Essential (primary) hypertension: Secondary | ICD-10-CM | POA: Diagnosis not present

## 2022-09-26 ENCOUNTER — Other Ambulatory Visit: Payer: Self-pay | Admitting: Family

## 2022-10-12 ENCOUNTER — Ambulatory Visit
Admission: RE | Admit: 2022-10-12 | Discharge: 2022-10-12 | Disposition: A | Discharge status: Home / Self Care | Payer: Medicare Other | Source: Ambulatory Visit | Attending: Family | Admitting: Family

## 2022-10-12 DIAGNOSIS — Z780 Asymptomatic menopausal state: Secondary | ICD-10-CM | POA: Insufficient documentation

## 2022-10-12 DIAGNOSIS — M8580 Other specified disorders of bone density and structure, unspecified site: Secondary | ICD-10-CM

## 2022-10-12 DIAGNOSIS — Z1231 Encounter for screening mammogram for malignant neoplasm of breast: Secondary | ICD-10-CM | POA: Diagnosis not present

## 2022-10-12 DIAGNOSIS — E559 Vitamin D deficiency, unspecified: Secondary | ICD-10-CM | POA: Diagnosis not present

## 2022-10-12 DIAGNOSIS — M8589 Other specified disorders of bone density and structure, multiple sites: Secondary | ICD-10-CM | POA: Diagnosis not present

## 2022-10-12 DIAGNOSIS — M85832 Other specified disorders of bone density and structure, left forearm: Secondary | ICD-10-CM | POA: Diagnosis not present

## 2022-11-28 DIAGNOSIS — I129 Hypertensive chronic kidney disease with stage 1 through stage 4 chronic kidney disease, or unspecified chronic kidney disease: Secondary | ICD-10-CM | POA: Diagnosis not present

## 2022-11-28 DIAGNOSIS — N1832 Chronic kidney disease, stage 3b: Secondary | ICD-10-CM | POA: Diagnosis not present

## 2022-11-28 DIAGNOSIS — I10 Essential (primary) hypertension: Secondary | ICD-10-CM | POA: Diagnosis not present

## 2022-11-29 DIAGNOSIS — I129 Hypertensive chronic kidney disease with stage 1 through stage 4 chronic kidney disease, or unspecified chronic kidney disease: Secondary | ICD-10-CM | POA: Diagnosis not present

## 2022-11-29 DIAGNOSIS — I10 Essential (primary) hypertension: Secondary | ICD-10-CM | POA: Diagnosis not present

## 2022-11-29 DIAGNOSIS — N1832 Chronic kidney disease, stage 3b: Secondary | ICD-10-CM | POA: Diagnosis not present

## 2022-12-03 ENCOUNTER — Ambulatory Visit (INDEPENDENT_AMBULATORY_CARE_PROVIDER_SITE_OTHER): Payer: Medicare Other | Admitting: Family

## 2022-12-03 ENCOUNTER — Encounter: Payer: Self-pay | Admitting: Family

## 2022-12-03 VITALS — BP 118/70 | HR 72 | Temp 98.0°F | Ht 64.0 in | Wt 185.4 lb

## 2022-12-03 DIAGNOSIS — Z13220 Encounter for screening for lipoid disorders: Secondary | ICD-10-CM

## 2022-12-03 DIAGNOSIS — L989 Disorder of the skin and subcutaneous tissue, unspecified: Secondary | ICD-10-CM | POA: Diagnosis not present

## 2022-12-03 DIAGNOSIS — Z136 Encounter for screening for cardiovascular disorders: Secondary | ICD-10-CM | POA: Diagnosis not present

## 2022-12-03 DIAGNOSIS — I10 Essential (primary) hypertension: Secondary | ICD-10-CM | POA: Diagnosis not present

## 2022-12-03 DIAGNOSIS — Z0001 Encounter for general adult medical examination with abnormal findings: Secondary | ICD-10-CM

## 2022-12-03 DIAGNOSIS — R809 Proteinuria, unspecified: Secondary | ICD-10-CM

## 2022-12-03 DIAGNOSIS — E785 Hyperlipidemia, unspecified: Secondary | ICD-10-CM

## 2022-12-03 DIAGNOSIS — Z87898 Personal history of other specified conditions: Secondary | ICD-10-CM | POA: Diagnosis not present

## 2022-12-03 DIAGNOSIS — Z0000 Encounter for general adult medical examination without abnormal findings: Secondary | ICD-10-CM | POA: Diagnosis not present

## 2022-12-03 DIAGNOSIS — Z780 Asymptomatic menopausal state: Secondary | ICD-10-CM | POA: Diagnosis not present

## 2022-12-03 MED ORDER — LISINOPRIL 20 MG PO TABS
20.0000 mg | ORAL_TABLET | Freq: Every day | ORAL | 3 refills | Status: DC
Start: 2022-12-03 — End: 2022-12-31

## 2022-12-03 NOTE — Progress Notes (Signed)
Assessment & Plan:  Encounter for preventative adult health care exam with abnormal findings Assessment & Plan: Deferred clinical breast exam due to patient preference.  Patient is no longer screening for colonoscopy.  She politely declines follow-up with gastroenterology despite advice to repeat in 3 years.  Concern for seborrheic keratosis versus squamous cell carcinoma on left side of face.  Referral dermatology for further evaluation.  Encouraged continued exercise.  Encouraged Tdap, Shingrix vaccines at local pharmacy  Orders: -     Ambulatory referral to Dermatology  Essential hypertension -     TSH; Future -     Lisinopril; Take 1 tablet (20 mg total) by mouth daily.  Dispense: 90 tablet; Refill: 3  Microalbuminuria -     Lisinopril; Take 1 tablet (20 mg total) by mouth daily.  Dispense: 90 tablet; Refill: 3  Asymptomatic postmenopausal state -     VITAMIN D 25 Hydroxy (Vit-D Deficiency, Fractures); Future  Encounter for lipid screening for cardiovascular disease  Hyperlipidemia, unspecified hyperlipidemia type -     Lipid panel; Future -     Hepatic function panel; Future  History of prediabetes -     Hemoglobin A1c; Future  Skin lesion -     Ambulatory referral to Dermatology  Annual physical exam Assessment & Plan: Deferred clinical breast exam due to patient preference.  Patient is no longer screening for colonoscopy.  She politely declines follow-up with gastroenterology despite advice to repeat in 3 years.  Concern for seborrheic keratosis versus squamous cell carcinoma on left side of face.  Referral dermatology for further evaluation.  Encouraged continued exercise.  Encouraged Tdap, Shingrix vaccines at local pharmacy      Return precautions given.   Risks, benefits, and alternatives of the medications and treatment plan prescribed today were discussed, and patient expressed understanding.   Education regarding symptom management and diagnosis given to  patient on AVS either electronically or printed.  Return in about 1 year (around 12/03/2023) for Fasting labs in 2-3 weeks.  Rennie Plowman, FNP  Subjective:    Patient ID: Lorie Phenix, female    DOB: 09/13/1940, 82 y.o.   MRN: [id number]  CC: Nejla Reasor is a 82 y.o. female who presents today for physical exam.    HPI: She has noted scratchy lesion on left cheek of face.  Slightly tender.  Nonbleeding.  No history of skin cancer    She continues to follow with nephrology, last seen 11/28/2022.  No medication changes. He is monitoring urine microalbumin to creatinine ratio.   Colorectal Cancer Screening: she is no longer screening for colon cancer.  She denies rectal bleeding, change in stool shape or diameter.  She politely declines follow-up with Dr. Tobi Bastos for screening colonoscopy  last colonoscopy was 11/2016 Breast Cancer Screening: Mammogram UTD Cervical Cancer Screening: No longer screening for cervical cancer Bone Health screening/DEXA for 65+:10/12/22 which reflected normal to osteopenia.  Lung Cancer Screening: Doesn't have 20 year pack year history and age > 72 years yo 63 years        Tetanus - due        Pneumococcal - Complete  Exercise: Gets regular exercise working in yard.   Alcohol use: Occasional Smoking/tobacco use: former smoker.  quit 1965  Health Maintenance  Topic Date Due   DTaP/Tdap/Td vaccine (1 - Tdap) Never done   Zoster (Shingles) Vaccine (1 of 2) Never done   COVID-19 Vaccine (1 - 2023-24 season) Never done   [facility] Annual Wellness Visit  10/14/2022   Flu Shot  12/13/2022   Pneumonia Vaccine  Completed   DEXA scan (bone density measurement)  Completed   HPV Vaccine  Aged Out   Colon Cancer Screening  Discontinued    ALLERGIES: Influenza vaccines  Current Outpatient Medications on File Prior to Visit  Medication Sig Dispense Refill   rosuvastatin (CRESTOR) 40 MG tablet TAKE 1 TABLET BY MOUTH DAILY 100 tablet 2   No current  facility-administered medications on file prior to visit.    Review of Systems    Objective:    BP 118/70   Pulse 72   Temp 98 F (36.7 C) (Oral)   Ht 5\' 4"  (1.626 m)   Wt 185 lb 6.4 oz (84.1 kg)   SpO2 98%   BMI 31.82 kg/m   BP Readings from Last 3 Encounters:  12/03/22 118/70  01/03/22 122/66  11/29/21 128/80   Wt Readings from Last 3 Encounters:  12/03/22 185 lb 6.4 oz (84.1 kg)  01/03/22 187 lb 6.4 oz (85 kg)  11/29/21 189 lb 3.2 oz (85.8 kg)    Physical Exam Vitals reviewed.  Constitutional:      Appearance: She is well-developed.  HENT:     Head:      Comments: Less than 1 cm stuck on lesion.  Nonbleeding, nontender Eyes:     Conjunctiva/sclera: Conjunctivae normal.  Neck:     Thyroid: No thyroid mass or thyromegaly.  Cardiovascular:     Rate and Rhythm: Normal rate and regular rhythm.     Pulses: Normal pulses.     Heart sounds: Normal heart sounds.  Pulmonary:     Effort: Pulmonary effort is normal.     Breath sounds: Normal breath sounds. No wheezing, rhonchi or rales.  Lymphadenopathy:     Head:     Right side of head: No submental, submandibular, tonsillar, preauricular, posterior auricular or occipital adenopathy.     Left side of head: No submental, submandibular, tonsillar, preauricular, posterior auricular or occipital adenopathy.     Cervical: No cervical adenopathy.  Skin:    General: Skin is warm and dry.  Neurological:     Mental Status: She is alert.  Psychiatric:        Speech: Speech normal.        Behavior: Behavior normal.        Thought Content: Thought content normal.

## 2022-12-03 NOTE — Patient Instructions (Addendum)
Please have Tdap ( tetanus ) vaccine done   Referral dermatology.  Please let me know if referral is very far out or if lesion on your face were to change.  Let us know if you dont hear back within a week in regards to an appointment being scheduled.   So that you are aware, if you are [facility] MyChart user , please pay attention to your MyChart messages as you may receive a MyChart message with a phone number to call and schedule this test/appointment own your own from our referral coordinator. This is a new process so I do not want you to miss this message.  If you are not a MyChart user, you will receive a phone call.  Health Maintenance for Postmenopausal Women Menopause is a normal process in which your ability to get pregnant comes to an end. This process happens slowly over many months or years, usually between the ages of 67 and 39. Menopause is complete when you have missed your menstrual period for 12 months. It is important to talk with your health care provider about some of the most common conditions that affect women after menopause (postmenopausal women). These include heart disease, cancer, and bone loss (osteoporosis). Adopting a healthy lifestyle and getting preventive care can help to promote your health and wellness. The actions you take can also lower your chances of developing some of these common conditions. What are the signs and symptoms of menopause? During menopause, you may have the following symptoms: Hot flashes. These can be moderate or severe. Night sweats. Decrease in sex drive. Mood swings. Headaches. Tiredness (fatigue). Irritability. Memory problems. Problems falling asleep or staying asleep. Talk with your health care provider about treatment options for your symptoms. Do I need hormone replacement therapy? Hormone replacement therapy is effective in treating symptoms that are caused by menopause, such as hot flashes and night sweats. Hormone replacement  carries certain risks, especially as you become older. If you are thinking about using estrogen or estrogen with progestin, discuss the benefits and risks with your health care provider. How can I reduce my risk for heart disease and stroke? The risk of heart disease, heart attack, and stroke increases as you age. One of the causes may be a change in the body's hormones during menopause. This can affect how your body uses dietary fats, triglycerides, and cholesterol. Heart attack and stroke are medical emergencies. There are many things that you can do to help prevent heart disease and stroke. Watch your blood pressure High blood pressure causes heart disease and increases the risk of stroke. This is more likely to develop in people who have high blood pressure readings or are overweight. Have your blood pressure checked: Every 3-5 years if you are 18-8 years of age. Every year if you are 28 years old or older. Eat a healthy diet  Eat a diet that includes plenty of vegetables, fruits, low-fat dairy products, and lean protein. Do not eat a lot of foods that are high in solid fats, added sugars, or sodium. Get regular exercise Get regular exercise. This is one of the most important things you can do for your health. Most adults should: Try to exercise for at least 150 minutes each week. The exercise should increase your heart rate and make you sweat (moderate-intensity exercise). Try to do strengthening exercises at least twice each week. Do these in addition to the moderate-intensity exercise. Spend less time sitting. Even light physical activity can be beneficial. Other tips  Work with your health care provider to achieve or maintain a healthy weight. Do not use any products that contain nicotine or tobacco. These products include cigarettes, chewing tobacco, and vaping devices, such as e-cigarettes. If you need help quitting, ask your health care provider. Know your numbers. Ask your health care  provider to check your cholesterol and your blood sugar (glucose). Continue to have your blood tested as directed by your health care provider. Do I need screening for cancer? Depending on your health history and family history, you may need to have cancer screenings at different stages of your life. This may include screening for: Breast cancer. Cervical cancer. Lung cancer. Colorectal cancer. What is my risk for osteoporosis? After menopause, you may be at increased risk for osteoporosis. Osteoporosis is a condition in which bone destruction happens more quickly than new bone creation. To help prevent osteoporosis or the bone fractures that can happen because of osteoporosis, you may take the following actions: If you are 66-41 years old, get at least 1,000 mg of calcium and at least 600 international units (IU) of vitamin D per day. If you are older than age 61 but younger than age 32, get at least 1,200 mg of calcium and at least 600 international units (IU) of vitamin D per day. If you are older than age 56, get at least 1,200 mg of calcium and at least 800 international units (IU) of vitamin D per day. Smoking and drinking excessive alcohol increase the risk of osteoporosis. Eat foods that are rich in calcium and vitamin D, and do weight-bearing exercises several times each week as directed by your health care provider. How does menopause affect my mental health? Depression may occur at any age, but it is more common as you become older. Common symptoms of depression include: Feeling depressed. Changes in sleep patterns. Changes in appetite or eating patterns. Feeling an overall lack of motivation or enjoyment of activities that you previously enjoyed. Frequent crying spells. Talk with your health care provider if you think that you are experiencing any of these symptoms. General instructions See your health care provider for regular wellness exams and vaccines. This may  include: Scheduling regular health, dental, and eye exams. Getting and maintaining your vaccines. These include: Influenza vaccine. Get this vaccine each year before the flu season begins. Pneumonia vaccine. Shingles vaccine. Tetanus, diphtheria, and pertussis (Tdap) booster vaccine. Your health care provider may also recommend other immunizations. Tell your health care provider if you have ever been abused or do not feel safe at home. Summary Menopause is a normal process in which your ability to get pregnant comes to an end. This condition causes hot flashes, night sweats, decreased interest in sex, mood swings, headaches, or lack of sleep. Treatment for this condition may include hormone replacement therapy. Take actions to keep yourself healthy, including exercising regularly, eating a healthy diet, watching your weight, and checking your blood pressure and blood sugar levels. Get screened for cancer and depression. Make sure that you are up to date with all your vaccines. This information is not intended to replace advice given to you by your health care provider. Make sure you discuss any questions you have with your health care provider. Document Revised: 09/19/2020 Document Reviewed: 09/19/2020 Elsevier Patient Education  2024 [employer].

## 2022-12-03 NOTE — Assessment & Plan Note (Signed)
Deferred clinical breast exam due to patient preference.  Patient is no longer screening for colonoscopy.  She politely declines follow-up with gastroenterology despite advice to repeat in 3 years.  Concern for seborrheic keratosis versus squamous cell carcinoma on left side of face.  Referral dermatology for further evaluation.  Encouraged continued exercise.  Encouraged Tdap, Shingrix vaccines at local pharmacy

## 2022-12-25 ENCOUNTER — Other Ambulatory Visit (INDEPENDENT_AMBULATORY_CARE_PROVIDER_SITE_OTHER): Payer: Medicare Other

## 2022-12-25 DIAGNOSIS — Z780 Asymptomatic menopausal state: Secondary | ICD-10-CM | POA: Diagnosis not present

## 2022-12-25 DIAGNOSIS — I10 Essential (primary) hypertension: Secondary | ICD-10-CM

## 2022-12-25 DIAGNOSIS — Z87898 Personal history of other specified conditions: Secondary | ICD-10-CM | POA: Diagnosis not present

## 2022-12-25 DIAGNOSIS — E785 Hyperlipidemia, unspecified: Secondary | ICD-10-CM | POA: Diagnosis not present

## 2022-12-25 LAB — LIPID PANEL
Cholesterol: 181 mg/dL (ref 0–200)
HDL: 85.2 mg/dL (ref >39.00)
LDL Cholesterol: 81 mg/dL (ref 0–99)
NonHDL: 95.86
Total CHOL/HDL Ratio: 2
Triglycerides: 73 mg/dL (ref 0.0–149.0)
VLDL: 14.6 mg/dL (ref 0.0–40.0)

## 2022-12-25 LAB — HEPATIC FUNCTION PANEL
ALT: 13 U/L (ref 0–35)
AST: 17 U/L (ref 0–37)
Albumin: 4.5 g/dL (ref 3.5–5.2)
Alkaline Phosphatase: 48 U/L (ref 39–117)
Bilirubin, Direct: 0.1 mg/dL (ref 0.0–0.3)
Total Bilirubin: 0.6 mg/dL (ref 0.2–1.2)
Total Protein: 7.2 g/dL (ref 6.0–8.3)

## 2022-12-25 LAB — HEMOGLOBIN A1C: Hgb A1c MFr Bld: 6.1 % (ref 4.6–6.5)

## 2022-12-25 LAB — VITAMIN D 25 HYDROXY (VIT D DEFICIENCY, FRACTURES): VITD: 51.99 ng/mL (ref 30.00–100.00)

## 2022-12-25 LAB — TSH: TSH: 1.46 u[IU]/mL (ref 0.35–5.50)

## 2022-12-30 ENCOUNTER — Other Ambulatory Visit: Payer: Self-pay | Admitting: Family

## 2022-12-30 DIAGNOSIS — I10 Essential (primary) hypertension: Secondary | ICD-10-CM

## 2022-12-30 DIAGNOSIS — R809 Proteinuria, unspecified: Secondary | ICD-10-CM

## 2023-01-17 ENCOUNTER — Telehealth: Payer: Self-pay | Admitting: Family

## 2023-01-17 DIAGNOSIS — X32XXXA Exposure to sunlight, initial encounter: Secondary | ICD-10-CM | POA: Diagnosis not present

## 2023-01-17 DIAGNOSIS — L249 Irritant contact dermatitis, unspecified cause: Secondary | ICD-10-CM | POA: Diagnosis not present

## 2023-01-17 DIAGNOSIS — L570 Actinic keratosis: Secondary | ICD-10-CM | POA: Diagnosis not present

## 2023-01-17 NOTE — Telephone Encounter (Signed)
Copied from CRM [id number]. Topic: Medicare AWV >> Jan 17, 2023 10:07 AM Payton Doughty wrote: Reason for CRM: LM 01/17/2023 to schedule AWV   Verlee Rossetti; Care Guide Ambulatory Clinical Support [place] [facility] Direct Dial: [phone]

## 2023-01-29 ENCOUNTER — Telehealth: Payer: Self-pay | Admitting: Family

## 2023-01-29 NOTE — Telephone Encounter (Signed)
Copied from CRM [id number]. Topic: Medicare AWV >> Jan 29, 2023  2:54 PM Payton Doughty wrote: Reason for CRM: Called 01/29/2023 to sched AWV - NO VOICEMAIL  Verlee Rossetti; Care Guide Ambulatory Clinical Support [place] [facility] Direct Dial: [phone]

## 2023-03-04 DIAGNOSIS — I129 Hypertensive chronic kidney disease with stage 1 through stage 4 chronic kidney disease, or unspecified chronic kidney disease: Secondary | ICD-10-CM | POA: Diagnosis not present

## 2023-03-04 DIAGNOSIS — R8290 Unspecified abnormal findings in urine: Secondary | ICD-10-CM | POA: Diagnosis not present

## 2023-03-04 DIAGNOSIS — N1832 Chronic kidney disease, stage 3b: Secondary | ICD-10-CM | POA: Diagnosis not present

## 2023-03-05 ENCOUNTER — Encounter: Payer: Self-pay | Admitting: Emergency Medicine

## 2023-03-06 ENCOUNTER — Ambulatory Visit: Payer: Medicare Other

## 2023-03-06 VITALS — Ht 64.5 in | Wt 180.0 lb

## 2023-03-06 DIAGNOSIS — Z0000 Encounter for general adult medical examination without abnormal findings: Secondary | ICD-10-CM

## 2023-03-06 NOTE — Progress Notes (Signed)
Subjective:   Hannah Lee is a 82 y.o. female who presents for Medicare Annual (Subsequent) preventive examination.  Visit Complete: Virtual I connected with  Hannah Lee on 03/06/23 by a audio enabled telemedicine application and verified that I am speaking with the correct person using two identifiers.  Patient Location: Home  Provider Location: Home Office  I discussed the limitations of evaluation and management by telemedicine. The patient expressed understanding and agreed to proceed.  Vital Signs: Because this visit was a virtual/telehealth visit, some criteria may be missing or patient reported. Any vitals not documented were not able to be obtained and vitals that have been documented are patient reported.   Cardiac Risk Factors include: advanced age (>44men, >73 women);hypertension;dyslipidemia;obesity (BMI >30kg/m2)     Objective:    Today's Vitals   03/06/23 1247  Weight: 180 lb (81.6 kg)  Height: 5' 4.5" (1.638 m)   Body mass index is 30.42 kg/m.     03/06/2023   12:55 PM 10/13/2021   11:42 AM 10/12/2020   12:42 PM 10/29/2017    8:32 AM 12/06/2016    8:48 AM 10/26/2016    8:27 AM  Advanced Directives  Does Patient Have a Medical Advance Directive? Yes Yes Yes Yes Yes Yes  Type of Sales promotion account executive of [employer] Power of [place];Living will Healthcare Power of [place];Living will Healthcare Power of [employer] of [place];Living will  Does patient want to make changes to medical advance directive? No - Patient declined No - Patient declined No - Patient declined No - Patient declined  No - Patient declined  Copy of Healthcare Power of Attorney in Chart? No - copy requested No - copy requested No - copy requested No - copy requested No - copy requested No - copy requested    Current Medications (verified) Outpatient Encounter Medications as of 03/06/2023  Medication Sig   lisinopril  (ZESTRIL) 20 MG tablet TAKE 1 TABLET BY MOUTH DAILY   rosuvastatin (CRESTOR) 40 MG tablet TAKE 1 TABLET BY MOUTH DAILY   No facility-administered encounter medications on file as of 03/06/2023.    Allergies (verified) Influenza vaccines   History: Past Medical History:  Diagnosis Date   Allergy    Seasonal / Environmental - trees, pollen, dust   Arthritis    Chicken pox    Colon polyp 2011   Colonoscopy   Hyperlipidemia    Hypertension    Kidney stones    Told had through test   Past Surgical History:  Procedure Laterality Date   COLONOSCOPY WITH PROPOFOL N/A 12/06/2016   Procedure: COLONOSCOPY WITH PROPOFOL;  Surgeon: Wyline Mood, MD;  Location: [facility] ENDOSCOPY;  Service: Endoscopy;  Laterality: N/A;   TONSILLECTOMY AND ADENOIDECTOMY  1951   Family History  Problem Relation Age of Onset   Hyperlipidemia Father    Hypertension Father    Stroke Father    Colon cancer Sister        colon cancer   Alzheimer's disease Sister    Aortic aneurysm Brother 3       Ruptured Aneurysm   Cancer Mother        Gall bladder   Heart disease Paternal Grandmother    Heart disease Paternal Grandfather    Cancer Daughter 61       died of leukemia   Aortic aneurysm Son    Social History   Socioeconomic History   Marital status: Married    Spouse name: Hannah Lee  Number of children: 2   Years of education: 12   Highest education level: Not on file  Occupational History   Occupation: Retired    Comment: Hairdresser  Tobacco Use   Smoking status: Former    Current packs/day: 0.00    Average packs/day: 0.5 packs/day for 2.0 years (1.0 ttl pk-yrs)    Types: Cigarettes    Start date: 10/29/1961    Quit date: 10/30/1963    Years since quitting: 59.3   Smokeless tobacco: Never  Vaping Use   Vaping status: Never Used  Substance and Sexual Activity   Alcohol use: Yes    Alcohol/week: 4.0 standard drinks of alcohol    Types: 4 Glasses of wine per week    Comment: 1-2 glasses of wine  twice a week on the weekends   Drug use: No   Sexual activity: Never  Other Topics Concern   Not on file  Social History Narrative   Born and raised in [place], [place]. Retirement brought down to [place] to escape the cold. Retired Interior and spatial designer. Has recently moved, gardening, sewing. Working out every day. Walking, pilates, lifting weights at home. Married (Hannah Lee) with two children. Daughter died age 6 from leukemia. Son Hannah Lee) lives in [place]. They have a granddaughter Hannah Lee) who is attending [employer].   Social Determinants of Health   Financial Resource Strain: Low Risk  (03/06/2023)   Overall Financial Resource Strain (CARDIA)    Difficulty of Paying Living Expenses: Not hard at all  Food Insecurity: No Food Insecurity (03/06/2023)   Hunger Vital Sign    Worried About Running Out of Food in the Last Year: Never true    Ran Out of Food in the Last Year: Never true  Transportation Needs: No Transportation Needs (03/06/2023)   PRAPARE - Administrator, Civil Service (Medical): No    Lack of Transportation (Non-Medical): No  Physical Activity: Insufficiently Active (03/06/2023)   Exercise Vital Sign    Days of Exercise per Week: 3 days    Minutes of Exercise per Session: 20 min  Stress: No Stress Concern Present (03/06/2023)   [employer] of Occupational Health - Occupational Stress Questionnaire    Feeling of Stress : Not at all  Social Connections: Moderately Isolated (03/06/2023)   Social Connection and Isolation Panel [NHANES]    Frequency of Communication with Friends and Family: More than three times a week    Frequency of Social Gatherings with Friends and Family: Once a week    Attends Religious Services: Never    Database administrator or Organizations: No    Attends Engineer, structural: Never    Marital Status: Married    Tobacco Counseling Counseling given: Not Answered   Clinical Intake:  Pre-visit preparation completed:  Yes  Pain : No/denies pain     BMI - recorded: 30.42 Nutritional Status: BMI > 30  Obese Nutritional Risks: None Diabetes: No  How often do you need to have someone help you when you read instructions, pamphlets, or other written materials from your doctor or pharmacy?: 1 - Never  Interpreter Needed?: No  Information entered by :: Tora Kindred, CMA   Activities of Daily Living    03/06/2023   12:49 PM  In your present state of health, do you have any difficulty performing the following activities:  Hearing? 0  Vision? 0  Difficulty concentrating or making decisions? 0  Walking or climbing stairs? 0  Dressing or bathing? 0  Doing errands,  shopping? 0  Preparing Food and eating ? N  Using the Toilet? N  In the past six months, have you accidently leaked urine? N  Do you have problems with loss of bowel control? N  Managing your Medications? N  Managing your Finances? N  Housekeeping or managing your Housekeeping? N    Patient Care Team: Allegra Grana, FNP as PCP - General (Family Medicine)  Indicate any recent Medical Services you may have received from other than [facility] providers in the past year (date may be approximate).     Assessment:   This is a routine wellness examination for [place].  Hearing/Vision screen Hearing Screening - Comments:: Denies hearing loss Vision Screening - Comments:: Gets routine eye exams   Goals Addressed               This Visit's Progress     Exercise 150 min/wk Moderate Activity (pt-stated)        Depression Screen    03/06/2023   12:54 PM 12/03/2022   10:35 AM 11/29/2021    8:15 AM 10/13/2021   11:39 AM 10/12/2020   12:28 PM 12/09/2019   11:06 AM 01/30/2019    8:10 AM  PHQ 2/9 Scores  PHQ - 2 Score 0 0 0 0 0 0 0  PHQ- 9 Score 0 0     0    Fall Risk    03/06/2023   12:56 PM 12/03/2022   10:35 AM 11/29/2021    8:15 AM 10/13/2021   11:43 AM 10/12/2020   12:29 PM  Fall Risk   Falls in the past year? 0 0 0 0 0   Number falls in past yr: 0 0 0 0 0  Injury with Fall? 0 0 0  0  Risk for fall due to : No Fall Risks No Fall Risks No Fall Risks    Follow up Falls prevention discussed Falls evaluation completed Falls evaluation completed Falls evaluation completed Falls evaluation completed    MEDICARE RISK AT HOME: Medicare Risk at Home Any stairs in or around the home?: No If so, are there any without handrails?: No Home free of loose throw rugs in walkways, pet beds, electrical cords, etc?: Yes Adequate lighting in your home to reduce risk of falls?: Yes Life alert?: No Use of a cane, walker or w/c?: No Grab bars in the bathroom?: No Shower chair or bench in shower?: Yes Elevated toilet seat or a handicapped toilet?: Yes  TIMED UP AND GO:  Was the test performed?  No    Cognitive Function:    10/29/2017    8:39 AM 10/26/2016    8:35 AM  MMSE - Mini Mental State Exam  Orientation to time 5 5  Orientation to Place 5 5  Registration 3 3  Attention/ Calculation 5 5  Recall 3 3  Language- name 2 objects 2 2  Language- repeat 1 1  Language- follow 3 step command 3 3  Language- read & follow direction 1 1  Write a sentence 1 1  Copy design 1 1  Total score 30 30        03/06/2023   12:57 PM  6CIT Screen  What Year? 0 points  What month? 3 points  What time? 0 points  Count back from 20 0 points  Months in reverse 2 points  Repeat phrase 0 points  Total Score 5 points    Immunizations Immunization History  Administered Date(s) Administered   Influenza, High Dose Seasonal  PF 07/05/2016   Influenza,inj,Quad PF,6+ Mos 03/25/2014, 01/30/2019, 06/10/2020, 01/03/2022   Pneumococcal Conjugate-13 10/29/2013   Pneumococcal Polysaccharide-23 11/23/2015    TDAP status: Due, Education has been provided regarding the importance of this vaccine. Advised may receive this vaccine at local pharmacy or Health Dept. Aware to provide a copy of the vaccination record if obtained from local  pharmacy or Health Dept. Verbalized acceptance and understanding.  Flu Vaccine status: Due, Education has been provided regarding the importance of this vaccine. Advised may receive this vaccine at local pharmacy or Health Dept. Aware to provide a copy of the vaccination record if obtained from local pharmacy or Health Dept. Verbalized acceptance and understanding.  Pneumococcal vaccine status: Up to date  Covid-19 vaccine status: Declined, Education has been provided regarding the importance of this vaccine but patient still declined. Advised may receive this vaccine at local pharmacy or Health Dept.or vaccine clinic. Aware to provide a copy of the vaccination record if obtained from local pharmacy or Health Dept. Verbalized acceptance and understanding.  Qualifies for Shingles Vaccine? Yes   Zostavax completed No   Shingrix Completed?: No.    Education has been provided regarding the importance of this vaccine. Patient has been advised to call insurance company to determine out of pocket expense if they have not yet received this vaccine. Advised may also receive vaccine at local pharmacy or Health Dept. Verbalized acceptance and understanding.  Screening Tests Health Maintenance  Topic Date Due   DTaP/Tdap/Td (1 - Tdap) Never done   Zoster Vaccines- Shingrix (1 of 2) Never done   INFLUENZA VACCINE  12/13/2022   COVID-19 Vaccine (1 - 2023-24 season) Never done   Medicare Annual Wellness (AWV)  03/05/2024   Pneumonia Vaccine 26+ Years old  Completed   DEXA SCAN  Completed   HPV VACCINES  Aged Out   Colonoscopy  Discontinued    Health Maintenance  Health Maintenance Due  Topic Date Due   DTaP/Tdap/Td (1 - Tdap) Never done   Zoster Vaccines- Shingrix (1 of 2) Never done   INFLUENZA VACCINE  12/13/2022   COVID-19 Vaccine (1 - 2023-24 season) Never done    Colorectal cancer screening: No longer required.   Mammogram status: Completed 10/12/22. Repeat every year  Bone Density  status: Completed 10/12/22. Results reflect: Bone density results: OSTEOPENIA. Repeat every 2 years.  Lung Cancer Screening: (Low Dose CT Chest recommended if Age 47-80 years, 20 pack-year currently smoking OR have quit w/in 15years.) does not qualify.   Lung Cancer Screening Referral: n/a  Additional Screening:  Hepatitis C Screening: does not qualify;   Vision Screening: Recommended annual ophthalmology exams for early detection of glaucoma and other disorders of the eye.  Dental Screening: Recommended annual dental exams for proper oral hygiene  Community Resource Referral / Chronic Care Management: CRR required this visit?  No   CCM required this visit?  No     Plan:     I have personally reviewed and noted the following in the patient's chart:   Medical and social history Use of alcohol, tobacco or illicit drugs  Current medications and supplements including opioid prescriptions. Patient is not currently taking opioid prescriptions. Functional ability and status Nutritional status Physical activity Advanced directives List of other physicians Hospitalizations, surgeries, and ER visits in previous 12 months Vitals Screenings to include cognitive, depression, and falls Referrals and appointments  In addition, I have reviewed and discussed with patient certain preventive protocols, quality metrics, and best practice recommendations. A  written personalized care plan for preventive services as well as general preventive health recommendations were provided to patient.     Tora Kindred, CMA   03/06/2023   After Visit Summary: (MyChart) Due to this being a telephonic visit, the after visit summary with patients personalized plan was offered to patient via MyChart   Nurse Notes: 6 CIT Score - 5 Due for flu vaccine Declined Tdap, shingles and covid vaccines.

## 2023-03-06 NOTE — Patient Instructions (Addendum)
Hannah Lee , Thank you for taking time to come for your Medicare Wellness Visit. I appreciate your ongoing commitment to your health goals. Please review the following plan we discussed and let me know if I can assist you in the future.   Referrals/Orders/Follow-Ups/Clinician Recommendations: Get the flu shot at your earliest convenience. Your next mammogram will be due 10/12/23 and bone density due 10/11/24  This is a list of the screening recommended for you and due dates:  Health Maintenance  Topic Date Due   DTaP/Tdap/Td vaccine (1 - Tdap) Never done   Zoster (Shingles) Vaccine (1 of 2) Never done   COVID-19 Vaccine (1 - 2023-24 season) Never done   Flu Shot  08/12/2023*   Mammogram  10/12/2023   Medicare Annual Wellness Visit  03/05/2024   DEXA scan (bone density measurement)  10/11/2024   Pneumonia Vaccine  Completed   HPV Vaccine  Aged Out   Colon Cancer Screening  Discontinued  *Topic was postponed. The date shown is not the original due date.    Advanced directives: (Copy Requested) Please bring a copy of your health care power of attorney and living will to the office to be added to your chart at your convenience.  Next Medicare Annual Wellness Visit scheduled for next year: Yes, 03/10/24 @ 12:45pm

## 2023-03-11 DIAGNOSIS — N1832 Chronic kidney disease, stage 3b: Secondary | ICD-10-CM | POA: Diagnosis not present

## 2023-03-11 DIAGNOSIS — I10 Essential (primary) hypertension: Secondary | ICD-10-CM | POA: Diagnosis not present

## 2023-03-11 DIAGNOSIS — I129 Hypertensive chronic kidney disease with stage 1 through stage 4 chronic kidney disease, or unspecified chronic kidney disease: Secondary | ICD-10-CM | POA: Diagnosis not present

## 2023-03-11 DIAGNOSIS — R8281 Pyuria: Secondary | ICD-10-CM | POA: Diagnosis not present

## 2023-03-11 DIAGNOSIS — R809 Proteinuria, unspecified: Secondary | ICD-10-CM | POA: Diagnosis not present

## 2023-03-20 DIAGNOSIS — L570 Actinic keratosis: Secondary | ICD-10-CM | POA: Diagnosis not present

## 2023-03-20 DIAGNOSIS — D1801 Hemangioma of skin and subcutaneous tissue: Secondary | ICD-10-CM | POA: Diagnosis not present

## 2023-03-20 DIAGNOSIS — L249 Irritant contact dermatitis, unspecified cause: Secondary | ICD-10-CM | POA: Diagnosis not present

## 2023-03-20 DIAGNOSIS — L821 Other seborrheic keratosis: Secondary | ICD-10-CM | POA: Diagnosis not present

## 2023-06-12 ENCOUNTER — Ambulatory Visit: Payer: Medicare Other | Admitting: Podiatry

## 2023-06-12 ENCOUNTER — Encounter: Payer: Self-pay | Admitting: Podiatry

## 2023-06-12 DIAGNOSIS — M19072 Primary osteoarthritis, left ankle and foot: Secondary | ICD-10-CM | POA: Diagnosis not present

## 2023-06-12 MED ORDER — TRIAMCINOLONE ACETONIDE 40 MG/ML IJ SUSP
20.0000 mg | Freq: Once | INTRAMUSCULAR | Status: AC
Start: 2023-06-12 — End: 2023-06-12
  Administered 2023-06-12: 20 mg

## 2023-06-12 NOTE — Progress Notes (Signed)
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  States that this flared up again but has been almost 2 years since it bothers me.  She said the shot really helped last time wanted to know if we can do another.  Objective: Vital signs are stable alert oriented x 3 pulses are palpable to the left foot.  She has dorsal spurring no pulsatile masses to the dorsal aspect of the foot she does have tenderness at the tarsometatarsal joints on frontal plane range of motion and direct palpation.  The majority of her symptomatology is noted just lateral to the deep peroneal nerve.  Assessment: Cannot rule out the peroneal nerve neuritis or capsulitis of the tarsometatarsal joints left foot.  Plan: Discussed appropriate shoe gear.  I also injected the dorsal aspect of the foot 10 mg Kenalog 5 mg Marcaine point maximal tenderness.  Tolerated procedure well without complications.  Was given both oral and written home-going instructions will follow-up with [place] as needed.

## 2023-06-13 ENCOUNTER — Other Ambulatory Visit: Payer: Self-pay | Admitting: Family

## 2023-07-08 DIAGNOSIS — I10 Essential (primary) hypertension: Secondary | ICD-10-CM | POA: Diagnosis not present

## 2023-07-08 DIAGNOSIS — N1832 Chronic kidney disease, stage 3b: Secondary | ICD-10-CM | POA: Diagnosis not present

## 2023-07-08 DIAGNOSIS — I129 Hypertensive chronic kidney disease with stage 1 through stage 4 chronic kidney disease, or unspecified chronic kidney disease: Secondary | ICD-10-CM | POA: Diagnosis not present

## 2023-07-08 DIAGNOSIS — R8281 Pyuria: Secondary | ICD-10-CM | POA: Diagnosis not present

## 2023-07-08 DIAGNOSIS — R809 Proteinuria, unspecified: Secondary | ICD-10-CM | POA: Diagnosis not present

## 2023-07-15 DIAGNOSIS — I10 Essential (primary) hypertension: Secondary | ICD-10-CM | POA: Diagnosis not present

## 2023-07-15 DIAGNOSIS — I129 Hypertensive chronic kidney disease with stage 1 through stage 4 chronic kidney disease, or unspecified chronic kidney disease: Secondary | ICD-10-CM | POA: Diagnosis not present

## 2023-07-15 DIAGNOSIS — N1832 Chronic kidney disease, stage 3b: Secondary | ICD-10-CM | POA: Diagnosis not present

## 2023-07-15 DIAGNOSIS — R809 Proteinuria, unspecified: Secondary | ICD-10-CM | POA: Diagnosis not present

## 2023-07-24 ENCOUNTER — Telehealth: Payer: Self-pay | Admitting: Family

## 2023-07-24 NOTE — Telephone Encounter (Signed)
 Reviewed chart in reference to UACR Correction  She is following with Dr Thedore Mins for chronic proteinuria, last seen 07/15/23  Urine protein updated 07/08/23   A/p included below.   1. Hypertensive chronic kidney disease, unspecified, with chronic kidney disease stage I through stage IV, or unspecified  2. Chronic kidney disease stage 3B (HCC)  3. Essential hypertension  4. Proteinuria, not otherwise specified   1. 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 Stable. Creatinine ranging between 1.4-1.6 in 2024. Proteinuria labs. WBC noted in urine 10-20. Urine protein to creatinine ratio 0.956 Patient denies any dysuria. Continued on lisinopril, rosuvastatin for cardiovascular risk reduction We will continue to monitor  Avoiding SGLT2 inhibitor due to high risk of UTI

## 2023-11-08 ENCOUNTER — Other Ambulatory Visit: Payer: Self-pay | Admitting: Family

## 2023-11-08 DIAGNOSIS — R809 Proteinuria, unspecified: Secondary | ICD-10-CM

## 2023-11-08 DIAGNOSIS — I10 Essential (primary) hypertension: Secondary | ICD-10-CM

## 2023-11-16 ENCOUNTER — Encounter: Payer: Self-pay | Admitting: Emergency Medicine

## 2023-11-16 ENCOUNTER — Ambulatory Visit: Admission: EM | Admit: 2023-11-16 | Discharge: 2023-11-16 | Disposition: A | Discharge status: Home / Self Care

## 2023-11-16 DIAGNOSIS — J0140 Acute pansinusitis, unspecified: Secondary | ICD-10-CM | POA: Diagnosis not present

## 2023-11-16 MED ORDER — AZELASTINE HCL 0.1 % NA SOLN
1.0000 | Freq: Two times a day (BID) | NASAL | 1 refills | Status: AC
Start: 2023-11-16 — End: ?

## 2023-11-16 MED ORDER — AMOXICILLIN-POT CLAVULANATE 875-125 MG PO TABS
1.0000 | ORAL_TABLET | Freq: Two times a day (BID) | ORAL | 0 refills | Status: AC
Start: 2023-11-16 — End: ?

## 2023-11-16 NOTE — ED Provider Notes (Signed)
 [facility]  Note:  This document was prepared using Conservation officer, historic buildings and may include unintentional dictation errors.  MRN: [id number] DOB: 12/27/1940  Subjective:   Hannah Lee is a 83 y.o. female presenting for sinus congestion, pain, cough, nasal discharge x 7 days.  No fever, chest congestion, body aches, weakness, shortness of breath, chest pain.  Patient has not taken any over-the-counter medication to treat symptoms.  Patient reports past history of sinus infections with similar presentation to current symptoms.  No current facility-administered medications for this encounter.  Current Outpatient Medications:    amoxicillin -clavulanate (AUGMENTIN ) 875-125 MG tablet, Take 1 tablet by mouth every 12 (twelve) hours., Disp: 14 tablet, Rfl: 0   azelastine  (ASTELIN ) 0.1 % nasal spray, Place 1 spray into both nostrils 2 (two) times daily. Use in each nostril as directed, Disp: 30 mL, Rfl: 1   lisinopril  (ZESTRIL ) 20 MG tablet, TAKE 1 TABLET BY MOUTH DAILY, Disp: 100 tablet, Rfl: 2   rosuvastatin  (CRESTOR ) 40 MG tablet, TAKE 1 TABLET BY MOUTH DAILY, Disp: 100 tablet, Rfl: 2   Allergies  Allergen Reactions   Influenza Vaccines Rash    Cannot tolerate high dose. Likes regular dose.    Past Medical History:  Diagnosis Date   Allergy    Seasonal / Environmental - trees, pollen, dust   Arthritis    Chicken pox    Colon polyp 2011   Colonoscopy   Hyperlipidemia    Hypertension    Kidney stones    Told had through test     Past Surgical History:  Procedure Laterality Date   COLONOSCOPY WITH PROPOFOL  N/A 12/06/2016   Procedure: COLONOSCOPY WITH PROPOFOL ;  Surgeon: Therisa Bi, MD;  Location: [facility] ENDOSCOPY;  Service: Endoscopy;  Laterality: N/A;   TONSILLECTOMY AND ADENOIDECTOMY  1951    Family History  Problem Relation Age of Onset   Hyperlipidemia Father    Hypertension Father    Stroke Father    Colon cancer Sister        colon cancer    Alzheimer's disease Sister    Aortic aneurysm Brother 43       Ruptured Aneurysm   Cancer Mother        Gall bladder   Heart disease Paternal Grandmother    Heart disease Paternal Grandfather    Cancer Daughter 4       died of leukemia   Aortic aneurysm Son     Social History   Tobacco Use   Smoking status: Former    Current packs/day: 0.00    Average packs/day: 0.5 packs/day for 2.0 years (1.0 ttl pk-yrs)    Types: Cigarettes    Start date: 10/29/1961    Quit date: 10/30/1963    Years since quitting: 60.0   Smokeless tobacco: Never  Vaping Use   Vaping status: Never Used  Substance Use Topics   Alcohol use: Yes    Alcohol/week: 4.0 standard drinks of alcohol    Types: 4 Glasses of wine per week    Comment: 1-2 glasses of wine twice a week on the weekends   Drug use: No    ROS Refer to HPI for ROS details.  Objective:   Vitals: BP 115/70 (BP Location: Left Arm)   Pulse 100   Temp 98.1 F (36.7 C) (Oral)   Resp 20   SpO2 95%   Physical Exam Vitals and nursing note reviewed.  Constitutional:      General: She is not in acute distress.  Appearance: Normal appearance. She is well-developed. She is not ill-appearing or toxic-appearing.  HENT:     Head: Normocephalic.     Right Ear: Tympanic membrane, ear canal and external ear normal.     Left Ear: Tympanic membrane, ear canal and external ear normal.     Nose: Nasal tenderness, mucosal edema, congestion and rhinorrhea present.     Right Turbinates: Swollen.     Left Turbinates: Swollen.     Right Sinus: Maxillary sinus tenderness and frontal sinus tenderness present.     Left Sinus: Maxillary sinus tenderness and frontal sinus tenderness present.     Mouth/Throat:     Mouth: Mucous membranes are moist.     Pharynx: Oropharynx is clear. No oropharyngeal exudate or posterior oropharyngeal erythema.  Eyes:     General:        Right eye: No discharge.        Left eye: No discharge.     Extraocular  Movements: Extraocular movements intact.     Conjunctiva/sclera: Conjunctivae normal.  Cardiovascular:     Rate and Rhythm: Normal rate.  Pulmonary:     Effort: Pulmonary effort is normal. No respiratory distress.     Breath sounds: No stridor. No wheezing.  Musculoskeletal:     Cervical back: Neck supple.  Skin:    General: Skin is warm and dry.  Neurological:     General: No focal deficit present.     Mental Status: She is alert and oriented to person, place, and time.  Psychiatric:        Mood and Affect: Mood normal.        Behavior: Behavior normal.     Procedures  No results found for this or any previous visit (from the past 24 hours).  No results found.   Assessment and Plan :     Discharge Instructions       1. Acute non-recurrent pansinusitis (Primary) - azelastine  (ASTELIN ) 0.1 % nasal spray; Place 1 spray into both nostrils 2 (two) times daily. Use in each nostril as directed  Dispense: 30 mL; Refill: 1 - amoxicillin -clavulanate (AUGMENTIN ) 875-125 MG tablet; Take 1 tablet by mouth every 12 (twelve) hours.  Dispense: 14 tablet; Refill: 0 - Take ibuprofen 600 mg every 8 hours as needed for pain and inflammation secondary to acute sinusitis - Flush sinuses and nasal passage with saline rinse twice daily to remove any secondary sinus accumulation of mucus. - Continue to monitor symptoms for any change in severity if there is any escalation of current symptoms or development of new symptoms follow-up for further evaluation and management.      Jasani Dolney B Hartman Minahan   Rubert Frediani, [place], [place] 11/16/23 [phone]

## 2023-11-16 NOTE — Discharge Instructions (Addendum)
  1. Acute non-recurrent pansinusitis (Primary) - azelastine  (ASTELIN ) 0.1 % nasal spray; Place 1 spray into both nostrils 2 (two) times daily. Use in each nostril as directed  Dispense: 30 mL; Refill: 1 - amoxicillin -clavulanate (AUGMENTIN ) 875-125 MG tablet; Take 1 tablet by mouth every 12 (twelve) hours.  Dispense: 14 tablet; Refill: 0 - Take ibuprofen 600 mg every 8 hours as needed for pain and inflammation secondary to acute sinusitis - Flush sinuses and nasal passage with saline rinse twice daily to remove any secondary sinus accumulation of mucus. - Continue to monitor symptoms for any change in severity if there is any escalation of current symptoms or development of new symptoms follow-up for further evaluation and management.

## 2023-11-16 NOTE — ED Triage Notes (Signed)
 Patient reports that she thinks she has a sinuses infection. Productive cough with clear sputum. X 1 week.

## 2023-12-05 ENCOUNTER — Encounter: Payer: Self-pay | Admitting: Family

## 2023-12-05 ENCOUNTER — Ambulatory Visit: Payer: Medicare Other | Admitting: Family

## 2023-12-05 VITALS — BP 110/60 | HR 95 | Temp 98.1°F | Ht 64.5 in | Wt 179.0 lb

## 2023-12-05 DIAGNOSIS — E785 Hyperlipidemia, unspecified: Secondary | ICD-10-CM | POA: Diagnosis not present

## 2023-12-05 DIAGNOSIS — Z0000 Encounter for general adult medical examination without abnormal findings: Secondary | ICD-10-CM | POA: Diagnosis not present

## 2023-12-05 DIAGNOSIS — Z0001 Encounter for general adult medical examination with abnormal findings: Secondary | ICD-10-CM

## 2023-12-05 DIAGNOSIS — Z780 Asymptomatic menopausal state: Secondary | ICD-10-CM | POA: Diagnosis not present

## 2023-12-05 DIAGNOSIS — I10 Essential (primary) hypertension: Secondary | ICD-10-CM

## 2023-12-05 DIAGNOSIS — R42 Dizziness and giddiness: Secondary | ICD-10-CM

## 2023-12-05 DIAGNOSIS — Z1231 Encounter for screening mammogram for malignant neoplasm of breast: Secondary | ICD-10-CM

## 2023-12-05 DIAGNOSIS — I951 Orthostatic hypotension: Secondary | ICD-10-CM | POA: Diagnosis not present

## 2023-12-05 DIAGNOSIS — R809 Proteinuria, unspecified: Secondary | ICD-10-CM

## 2023-12-05 LAB — CBC WITH DIFFERENTIAL/PLATELET
Basophils Absolute: 0 K/uL (ref 0.0–0.1)
Basophils Relative: 0.8 % (ref 0.0–3.0)
Eosinophils Absolute: 0.2 K/uL (ref 0.0–0.7)
Eosinophils Relative: 2.6 % (ref 0.0–5.0)
HCT: 35.6 % — ABNORMAL LOW (ref 36.0–46.0)
Hemoglobin: 11.8 g/dL — ABNORMAL LOW (ref 12.0–15.0)
Lymphocytes Relative: 33.1 % (ref 12.0–46.0)
Lymphs Abs: 2 K/uL (ref 0.7–4.0)
MCHC: 33.2 g/dL (ref 30.0–36.0)
MCV: 90.1 fl (ref 78.0–100.0)
Monocytes Absolute: 0.5 K/uL (ref 0.1–1.0)
Monocytes Relative: 8.3 % (ref 3.0–12.0)
Neutro Abs: 3.4 K/uL (ref 1.4–7.7)
Neutrophils Relative %: 55.2 % (ref 43.0–77.0)
Platelets: 218 K/uL (ref 150.0–400.0)
RBC: 3.95 Mil/uL (ref 3.87–5.11)
RDW: 13.2 % (ref 11.5–15.5)
WBC: 6.1 K/uL (ref 4.0–10.5)

## 2023-12-05 LAB — LIPID PANEL
Cholesterol: 154 mg/dL (ref 0–200)
HDL: 59.9 mg/dL (ref >39.00)
LDL Cholesterol: 79 mg/dL (ref 0–99)
NonHDL: 94.42
Total CHOL/HDL Ratio: 3
Triglycerides: 75 mg/dL (ref 0.0–149.0)
VLDL: 15 mg/dL (ref 0.0–40.0)

## 2023-12-05 LAB — VITAMIN D 25 HYDROXY (VIT D DEFICIENCY, FRACTURES): VITD: 58.4 ng/mL (ref 30.00–100.00)

## 2023-12-05 LAB — BASIC METABOLIC PANEL WITH GFR
BUN: 33 mg/dL — ABNORMAL HIGH (ref 6–23)
CO2: 24 meq/L (ref 19–32)
Calcium: 10 mg/dL (ref 8.4–10.5)
Chloride: 102 meq/L (ref 96–112)
Creatinine, Ser: 1.59 mg/dL — ABNORMAL HIGH (ref 0.40–1.20)
GFR: 30.32 mL/min — ABNORMAL LOW (ref >60.00)
Glucose, Bld: 116 mg/dL — ABNORMAL HIGH (ref 70–99)
Potassium: 3.9 meq/L (ref 3.5–5.1)
Sodium: 136 meq/L (ref 135–145)

## 2023-12-05 MED ORDER — MECLIZINE HCL 12.5 MG PO TABS: 12.5000 mg | ORAL_TABLET | Freq: Three times a day (TID) | ORAL | 0 refills | Status: AC | PRN

## 2023-12-05 MED ORDER — LISINOPRIL 5 MG PO TABS: 15.0000 mg | ORAL_TABLET | Freq: Every day | ORAL | 3 refills | Status: DC

## 2023-12-05 NOTE — Assessment & Plan Note (Addendum)
 Blood pressure sitting and standing indicates orthostatic hypotension ( unable to lay supine due to dizziness and questionable vertigo). We also discussed weight loss as likely contributory. Reduce lisinopril  to 15mg  qd. Close follow up

## 2023-12-05 NOTE — Progress Notes (Unsigned)
 Assessment & Plan:  Encounter for screening mammogram for malignant neoplasm of breast  Hyperlipidemia, unspecified hyperlipidemia type  Essential hypertension  Microalbuminuria  Encounter for preventative adult health care exam with abnormal findings  Asymptomatic postmenopausal state     Return precautions given.   Risks, benefits, and alternatives of the medications and treatment plan prescribed today were discussed, and patient expressed understanding.   Education regarding symptom management and diagnosis given to patient on AVS either electronically or printed.  No follow-ups on file.  Rollene Northern, FNP  Subjective:    Patient ID: Rosaline Bottoms, female    DOB: 1940-08-12, 83 y.o.   MRN: [id number]  CC: Jo-Anne Kluth is a 83 y.o. female who presents today for physical exam.    HPI: Complains of episodic dizziness , started 4 days ago. Intermittent. Aggrevated with 'turning her head'.  She has had 'spinning' when bending over.   No associated tinnitus, hearing loss.   BP 4 days ago in which HR 164, BP 84/60. Continued sinus pressure and cough, improved after   Cough is now sporadic and dry, worse when supine.  Denies ear pain, sore throat, CP, Sob, wheezing, left arm numbness, jaw pain, nausea.  No caffeine, decongestants. No nsaids.   Fever 4 weeks ago , since resolved   Seen UC 11/16/23 for sinus pressure and cough,augmentin , azelastine .    Family h/o AAA    Colorectal Cancer Screening: Due, dr therisa 11/2016 Breast Cancer Screening: Mammogram due Cervical Cancer Screening: no longer screening cervical cancer. Denies pelvic pain, vaginal bleeding.   Bone Health screening/DEXA for 65+:UTD, DEXA 10/02/22         Tetanus - due        Pneumococcal - Completed Exercise: Gets regular exercise.   Alcohol use:  former, quit 1965 Smoking/tobacco use: Nonsmoker.    Health Maintenance  Topic Date Due  . DTaP/Tdap/Td vaccine (1 - Tdap) Never done  .  Zoster (Shingles) Vaccine (1 of 2) Never done  . Mammogram  10/12/2023  . COVID-19 Vaccine (4 - 2024-25 season) 12/21/2023*  . Flu Shot  12/13/2023  . Medicare Annual Wellness Visit  03/05/2024  . DEXA scan (bone density measurement)  10/11/2024  . Pneumococcal Vaccine for age over 69  Completed  . Hepatitis B Vaccine  Aged Out  . HPV Vaccine  Aged Out  . Meningitis B Vaccine  Aged Out  . Colon Cancer Screening  Discontinued  *Topic was postponed. The date shown is not the original due date.    ALLERGIES: Influenza vaccines  Current Outpatient Medications on File Prior to Visit  Medication Sig Dispense Refill  . lisinopril  (ZESTRIL ) 20 MG tablet TAKE 1 TABLET BY MOUTH DAILY 100 tablet 2  . rosuvastatin  (CRESTOR ) 40 MG tablet TAKE 1 TABLET BY MOUTH DAILY 100 tablet 2  . amoxicillin -clavulanate (AUGMENTIN ) 875-125 MG tablet Take 1 tablet by mouth every 12 (twelve) hours. (Patient not taking: Reported on 12/05/2023) 14 tablet 0  . azelastine  (ASTELIN ) 0.1 % nasal spray Place 1 spray into both nostrils 2 (two) times daily. Use in each nostril as directed (Patient not taking: Reported on 12/05/2023) 30 mL 1   No current facility-administered medications on file prior to visit.    Review of Systems  Constitutional:  Negative for chills and fever.  HENT:  Positive for sinus pressure. Negative for congestion, ear discharge, ear pain, hearing loss, sinus pain, sore throat and tinnitus.   Eyes:  Negative for visual disturbance.  Respiratory:  Negative  for cough.   Cardiovascular:  Negative for chest pain and palpitations.  Gastrointestinal:  Negative for nausea and vomiting.  Neurological:  Positive for dizziness. Negative for syncope and headaches.      Objective:    BP 110/60   Pulse (!) 114   Temp 98.1 F (36.7 C)   Ht 5' 4.5 (1.638 m)   Wt 179 lb (81.2 kg)   SpO2 99%   BMI 30.25 kg/m   BP Readings from Last 3 Encounters:  12/05/23 110/60  11/16/23 115/70  12/03/22 118/70    Wt Readings from Last 3 Encounters:  12/05/23 179 lb (81.2 kg)  03/06/23 180 lb (81.6 kg)  12/03/22 185 lb 6.4 oz (84.1 kg)    Physical Exam Vitals reviewed.  Constitutional:      Appearance: She is well-developed.  HENT:     Head: Normocephalic and atraumatic.     Right Ear: Hearing, tympanic membrane, ear canal and external ear normal. No decreased hearing noted. No drainage, swelling or tenderness. No middle ear effusion. No foreign body. Tympanic membrane is not erythematous or bulging.     Left Ear: Hearing, tympanic membrane, ear canal and external ear normal. No decreased hearing noted. No drainage, swelling or tenderness.  No middle ear effusion. No foreign body. Tympanic membrane is not erythematous or bulging.     Nose: Nose normal. No rhinorrhea.     Right Sinus: No maxillary sinus tenderness or frontal sinus tenderness.     Left Sinus: No maxillary sinus tenderness or frontal sinus tenderness.     Mouth/Throat:     Pharynx: Uvula midline. No oropharyngeal exudate or posterior oropharyngeal erythema.     Tonsils: No tonsillar abscesses.  Eyes:     Conjunctiva/sclera: Conjunctivae normal.     Pupils: Pupils are equal, round, and reactive to light.     Comments: Fundus normal bilaterally.   Cardiovascular:     Rate and Rhythm: Normal rate and regular rhythm.     Pulses: Normal pulses.     Heart sounds: Normal heart sounds.  Pulmonary:     Effort: Pulmonary effort is normal.     Breath sounds: Normal breath sounds. No wheezing, rhonchi or rales.  Lymphadenopathy:     Head:     Right side of head: No submental, submandibular, tonsillar, preauricular, posterior auricular or occipital adenopathy.     Left side of head: No submental, submandibular, tonsillar, preauricular, posterior auricular or occipital adenopathy.     Cervical: No cervical adenopathy.  Skin:    General: Skin is warm and dry.  Neurological:     Mental Status: She is alert.     Cranial Nerves: No  cranial nerve deficit.     Sensory: No sensory deficit.     Deep Tendon Reflexes:     Reflex Scores:      Bicep reflexes are 2+ on the right side and 2+ on the left side.      Patellar reflexes are 2+ on the right side and 2+ on the left side.    Comments: Grip equal and strong bilateral upper extremities. Gait strong and steady. Able to perform rapid alternating movement without difficulty.  [employer] not performed as patient felt most comfortable sitting upright.    Psychiatric:        Speech: Speech normal.        Behavior: Behavior normal.        Thought Content: Thought content normal.

## 2023-12-05 NOTE — Patient Instructions (Addendum)
 Please call  and schedule your 3D mammogram and /or bone density scan as we discussed.   [facility]  ( new location in 2023)  [address], [place], [place] [address]  [place], [place]  [phone]   For sinus congestion, what we suspect is leftover, please start over the counter PLAIN mucinex ( guaifenesin) with nasal spray , Flonase.   For vertigo, if room begins to spin, I have prescribed a rescue medication called Meclizine  to take as needed.    We will also reduce lisinopril  from 20mg  to 15mg .   Please let me know if you continue to feel dizzy as we will further reduce lisinopril  to 10mg  daily.   Health Maintenance for Postmenopausal Women Menopause is a normal process in which your ability to get pregnant comes to an end. This process happens slowly over many months or years, usually between the ages of 20 and 52. Menopause is complete when you have missed your menstrual period for 12 months. It is important to talk with your health care provider about some of the most common conditions that affect women after menopause (postmenopausal women). These include heart disease, cancer, and bone loss (osteoporosis). Adopting a healthy lifestyle and getting preventive care can help to promote your health and wellness. The actions you take can also lower your chances of developing some of these common conditions. What are the signs and symptoms of menopause? During menopause, you may have the following symptoms: Hot flashes. These can be moderate or severe. Night sweats. Decrease in sex drive. Mood swings. Headaches. Tiredness (fatigue). Irritability. Memory problems. Problems falling asleep or staying asleep. Talk with your health care provider about treatment options for your symptoms. Do I need hormone replacement therapy? Hormone replacement therapy is effective in treating symptoms that are caused by menopause, such as hot flashes and night  sweats. Hormone replacement carries certain risks, especially as you become older. If you are thinking about using estrogen or estrogen with progestin, discuss the benefits and risks with your health care provider. How can I reduce my risk for heart disease and stroke? The risk of heart disease, heart attack, and stroke increases as you age. One of the causes may be a change in the body's hormones during menopause. This can affect how your body uses dietary fats, triglycerides, and cholesterol. Heart attack and stroke are medical emergencies. There are many things that you can do to help prevent heart disease and stroke. Watch your blood pressure High blood pressure causes heart disease and increases the risk of stroke. This is more likely to develop in people who have high blood pressure readings or are overweight. Have your blood pressure checked: Every 3-5 years if you are 11-29 years of age. Every year if you are 56 years old or older. Eat a healthy diet  Eat a diet that includes plenty of vegetables, fruits, low-fat dairy products, and lean protein. Do not eat a lot of foods that are high in solid fats, added sugars, or sodium. Get regular exercise Get regular exercise. This is one of the most important things you can do for your health. Most adults should: Try to exercise for at least 150 minutes each week. The exercise should increase your heart rate and make you sweat (moderate-intensity exercise). Try to do strengthening exercises at least twice each week. Do these in addition to the moderate-intensity exercise. Spend less time sitting. Even light physical activity can be beneficial. Other tips Work with your health  care provider to achieve or maintain a healthy weight. Do not use any products that contain nicotine or tobacco. These products include cigarettes, chewing tobacco, and vaping devices, such as e-cigarettes. If you need help quitting, ask your health care provider. Know your  numbers. Ask your health care provider to check your cholesterol and your blood sugar (glucose). Continue to have your blood tested as directed by your health care provider. Do I need screening for cancer? Depending on your health history and family history, you may need to have cancer screenings at different stages of your life. This may include screening for: Breast cancer. Cervical cancer. Lung cancer. Colorectal cancer. What is my risk for osteoporosis? After menopause, you may be at increased risk for osteoporosis. Osteoporosis is a condition in which bone destruction happens more quickly than new bone creation. To help prevent osteoporosis or the bone fractures that can happen because of osteoporosis, you may take the following actions: If you are 70-95 years old, get at least 1,000 mg of calcium  and at least 600 international units (IU) of vitamin D  per day. If you are older than age 61 but younger than age 58, get at least 1,200 mg of calcium  and at least 600 international units (IU) of vitamin D  per day. If you are older than age 33, get at least 1,200 mg of calcium  and at least 800 international units (IU) of vitamin D  per day. Smoking and drinking excessive alcohol increase the risk of osteoporosis. Eat foods that are rich in calcium  and vitamin D , and do weight-bearing exercises several times each week as directed by your health care provider. How does menopause affect my mental health? Depression may occur at any age, but it is more common as you become older. Common symptoms of depression include: Feeling depressed. Changes in sleep patterns. Changes in appetite or eating patterns. Feeling an overall lack of motivation or enjoyment of activities that you previously enjoyed. Frequent crying spells. Talk with your health care provider if you think that you are experiencing any of these symptoms. General instructions See your health care provider for regular wellness exams and  vaccines. This may include: Scheduling regular health, dental, and eye exams. Getting and maintaining your vaccines. These include: Influenza vaccine. Get this vaccine each year before the flu season begins. Pneumonia vaccine. Shingles vaccine. Tetanus, diphtheria, and pertussis (Tdap) booster vaccine. Your health care provider may also recommend other immunizations. Tell your health care provider if you have ever been abused or do not feel safe at home. Summary Menopause is a normal process in which your ability to get pregnant comes to an end. This condition causes hot flashes, night sweats, decreased interest in sex, mood swings, headaches, or lack of sleep. Treatment for this condition may include hormone replacement therapy. Take actions to keep yourself healthy, including exercising regularly, eating a healthy diet, watching your weight, and checking your blood pressure and blood sugar levels. Get screened for cancer and depression. Make sure that you are up to date with all your vaccines. This information is not intended to replace advice given to you by your health care provider. Make sure you discuss any questions you have with your health care provider. Document Revised: 09/19/2020 Document Reviewed: 09/19/2020 Elsevier Patient Education  2024 [employer].

## 2023-12-06 ENCOUNTER — Ambulatory Visit: Payer: Self-pay | Admitting: Family

## 2023-12-09 NOTE — Assessment & Plan Note (Signed)
 Reassuring neurologic exam.  Unable to perform Dix-Hallpike maneuver as patient felt dizzy and she did not feel comfortable lying supine .  presentation also consistent with vertigo versus eustachian tube dysfunction after sinus infection.  Advised Mucinex, Flonase.  Also have given her prescription for meclizine .Close follow up.

## 2023-12-09 NOTE — Assessment & Plan Note (Signed)
 Patient politely declined clinical breast exam.  She will schedule her mammogram.  Referral for colonoscopy

## 2023-12-11 ENCOUNTER — Ambulatory Visit: Admitting: Family

## 2023-12-13 ENCOUNTER — Telehealth: Payer: Self-pay | Admitting: Family

## 2023-12-13 NOTE — Telephone Encounter (Signed)
 Copied from CRM #[id number]. Topic: Appointments - Scheduling Inquiry for Clinic >> Dec 13, 2023 12:45 PM Berneda FALCON wrote: Reason for CRM: Patient wants to know if she still needs the appt on 8/29 since her labs were good overall.  Please call patient back at [phone]

## 2023-12-16 NOTE — Telephone Encounter (Signed)
 Called and spoke to pt and she stated that she will keep the appointment.

## 2023-12-17 ENCOUNTER — Ambulatory Visit: Admitting: Family

## 2024-01-10 ENCOUNTER — Ambulatory Visit (INDEPENDENT_AMBULATORY_CARE_PROVIDER_SITE_OTHER): Admitting: Family

## 2024-01-10 ENCOUNTER — Encounter: Payer: Self-pay | Admitting: Family

## 2024-01-10 VITALS — BP 135/70 | HR 78 | Temp 98.4°F | Ht 64.5 in | Wt 182.5 lb

## 2024-01-10 DIAGNOSIS — R42 Dizziness and giddiness: Secondary | ICD-10-CM | POA: Diagnosis not present

## 2024-01-10 DIAGNOSIS — I951 Orthostatic hypotension: Secondary | ICD-10-CM

## 2024-01-10 DIAGNOSIS — I10 Essential (primary) hypertension: Secondary | ICD-10-CM

## 2024-01-10 MED ORDER — LISINOPRIL 5 MG PO TABS: 15.0000 mg | ORAL_TABLET | Freq: Every day | ORAL | 3 refills | Status: AC

## 2024-01-10 NOTE — Assessment & Plan Note (Signed)
Resolved. Monitor for recurrence.

## 2024-01-10 NOTE — Progress Notes (Signed)
 Assessment & Plan:  Orthostatic hypotension Assessment & Plan: Resolved.  Will continue to monitor   Essential hypertension Assessment & Plan: Discussed blood pressure goal, including JNC 8 guidance and blood pressure less than 140/80.  Advised patient to continue lisinopril  15 mg today and to monitor blood pressure at home.  If blood pressure were to be persistently greater than 140/80, advised patient to resume lisinopril  20 mg daily.   Vertigo Assessment & Plan: Resolved.  Monitor for recurrence   Other orders -     Lisinopril ; Take 3 tablets (15 mg total) by mouth daily.  Dispense: 270 tablet; Refill: 3     Return precautions given.   Risks, benefits, and alternatives of the medications and treatment plan prescribed today were discussed, and patient expressed understanding.   Education regarding symptom management and diagnosis given to patient on AVS either electronically or printed.  Return for Complete Physical Exam.  Rollene Northern, FNP  Subjective:    Patient ID: Hannah Lee, female    DOB: 1941/04/24, 83 y.o.   MRN: [id number]  CC: Hannah Lee is a 83 y.o. female who presents today for follow up from 12/05/23 from vertigo.   HPI: Dizziness has resolved.  Denies chest pain, palpitations.  No further episodes of vertigo with position changes or turning her head.  Congestion has resolved.  She completed Mucinex.  She is continued on Flonase in anticipation of seasonal allergies.  She is compliant with lisinopril  50 mg daily.  She has not checked her blood pressure at home   Started flonase and mucinex Reduced lisinopril  to 15mg   Allergies: Influenza vaccines Current Outpatient Medications on File Prior to Visit  Medication Sig Dispense Refill   meclizine  (ANTIVERT ) 12.5 MG tablet Take 1 tablet (12.5 mg total) by mouth 3 (three) times daily as needed for dizziness. 30 tablet 0   rosuvastatin  (CRESTOR ) 40 MG tablet TAKE 1 TABLET BY MOUTH DAILY 100 tablet 2    amoxicillin -clavulanate (AUGMENTIN ) 875-125 MG tablet Take 1 tablet by mouth every 12 (twelve) hours. (Patient not taking: Reported on 01/10/2024) 14 tablet 0   azelastine  (ASTELIN ) 0.1 % nasal spray Place 1 spray into both nostrils 2 (two) times daily. Use in each nostril as directed (Patient not taking: Reported on 01/10/2024) 30 mL 1   No current facility-administered medications on file prior to visit.    Review of Systems  Constitutional:  Negative for chills, fatigue and fever.  Respiratory:  Negative for cough.   Cardiovascular:  Negative for chest pain and palpitations.  Gastrointestinal:  Negative for nausea and vomiting.  Neurological:  Negative for dizziness and syncope.      Objective:    BP 135/70 Comment: left arm  Pulse 78   Temp 98.4 F (36.9 C) (Oral)   Ht 5' 4.5 (1.638 m)   Wt 182 lb 8 oz (82.8 kg)   SpO2 97%   BMI 30.84 kg/m  BP Readings from Last 3 Encounters:  01/10/24 135/70  12/05/23 110/60  11/16/23 115/70   Wt Readings from Last 3 Encounters:  01/10/24 182 lb 8 oz (82.8 kg)  12/05/23 179 lb (81.2 kg)  03/06/23 180 lb (81.6 kg)    Physical Exam Vitals reviewed.  Constitutional:      Appearance: She is well-developed.  Eyes:     Conjunctiva/sclera: Conjunctivae normal.  Cardiovascular:     Rate and Rhythm: Normal rate and regular rhythm.     Pulses: Normal pulses.     Heart sounds: Normal heart  sounds.  Pulmonary:     Effort: Pulmonary effort is normal.     Breath sounds: Normal breath sounds. No wheezing, rhonchi or rales.  Skin:    General: Skin is warm and dry.  Neurological:     Mental Status: She is alert.  Psychiatric:        Speech: Speech normal.        Behavior: Behavior normal.        Thought Content: Thought content normal.

## 2024-01-10 NOTE — Assessment & Plan Note (Signed)
Resolved Will continue to monitor 

## 2024-01-10 NOTE — Assessment & Plan Note (Signed)
 Discussed blood pressure goal, including JNC 8 guidance and blood pressure less than 140/80.  Advised patient to continue lisinopril  15 mg today and to monitor blood pressure at home.  If blood pressure were to be persistently greater than 140/80, advised patient to resume lisinopril  20 mg daily.

## 2024-01-10 NOTE — Patient Instructions (Signed)
 Blood pressure acceptable today  Blood pressure goal between 150-140/80  I would recommend staying on lisinopril  15mg  daily for now; if you see readings greater than 140/80, please resume 20mg  lisinopril  dose.   Managing Your Hypertension Hypertension, also called high blood pressure, is when the force of the blood pressing against the walls of the arteries is too strong. Arteries are blood vessels that carry blood from your heart throughout your body. Hypertension forces the heart to work harder to pump blood and may cause the arteries to become narrow or stiff. Understanding blood pressure readings A blood pressure reading includes a higher number over a lower number: The first, or top, number is called the systolic pressure. It is a measure of the pressure in your arteries as your heart beats. The second, or bottom number, is called the diastolic pressure. It is a measure of the pressure in your arteries as the heart relaxes. For most people, a normal blood pressure is below 120/80. Your personal target blood pressure may vary depending on your medical conditions, your age, and other factors. Blood pressure is classified into four stages. Based on your blood pressure reading, your health care provider may use the following stages to determine what type of treatment you need, if any. Systolic pressure and diastolic pressure are measured in a unit called millimeters of mercury (mmHg). Normal Systolic pressure: below 120. Diastolic pressure: below 80. Elevated Systolic pressure: 120-129. Diastolic pressure: below 80. Hypertension stage 1 Systolic pressure: 130-139. Diastolic pressure: 80-89. Hypertension stage 2 Systolic pressure: 140 or above. Diastolic pressure: 90 or above. How can this condition affect me? Managing your hypertension is very important. Over time, hypertension can damage the arteries and decrease blood flow to parts of the body, including the brain, heart, and kidneys.  Having untreated or uncontrolled hypertension can lead to: A heart attack. A stroke. A weakened blood vessel (aneurysm). Heart failure. Kidney damage. Eye damage. Memory and concentration problems. Vascular dementia. What actions can I take to manage this condition? Hypertension can be managed by making lifestyle changes and possibly by taking medicines. Your health care provider will help you make a plan to bring your blood pressure within a normal range. You may be referred for counseling on a healthy diet and physical activity. Nutrition  Eat a diet that is high in fiber and potassium, and low in salt (sodium), added sugar, and fat. An example eating plan is called the DASH diet. DASH stands for Dietary Approaches to Stop Hypertension. To eat this way: Eat plenty of fresh fruits and vegetables. Try to fill one-half of your plate at each meal with fruits and vegetables. Eat whole grains, such as whole-wheat pasta, brown rice, or whole-grain bread. Fill about one-fourth of your plate with whole grains. Eat low-fat dairy products. Avoid fatty cuts of meat, processed or cured meats, and poultry with skin. Fill about one-fourth of your plate with lean proteins such as fish, chicken without skin, beans, eggs, and tofu. Avoid pre-made and processed foods. These tend to be higher in sodium, added sugar, and fat. Reduce your daily sodium intake. Many people with hypertension should eat less than 1,500 mg of sodium a day. Lifestyle  Work with your health care provider to maintain a healthy body weight or to lose weight. Ask what an ideal weight is for you. Get at least 30 minutes of exercise that causes your heart to beat faster (aerobic exercise) most days of the week. Activities may include walking, swimming, or biking. Include  exercise to strengthen your muscles (resistance exercise), such as weight lifting, as part of your weekly exercise routine. Try to do these types of exercises for 30  minutes at least 3 days a week. Do not use any products that contain nicotine or tobacco. These products include cigarettes, chewing tobacco, and vaping devices, such as e-cigarettes. If you need help quitting, ask your health care provider. Control any long-term (chronic) conditions you have, such as high cholesterol or diabetes. Identify your sources of stress and find ways to manage stress. This may include meditation, deep breathing, or making time for fun activities. Alcohol use Do not drink alcohol if: Your health care provider tells you not to drink. You are pregnant, may be pregnant, or are planning to become pregnant. If you drink alcohol: Limit how much you have to: 0-1 drink a day for women. 0-2 drinks a day for men. Know how much alcohol is in your drink. In the [place] one drink equals one 12 oz bottle of beer (355 mL), one 5 oz glass of wine (148 mL), or one 1 oz glass of hard liquor (44 mL). Medicines Your health care provider may prescribe medicine if lifestyle changes are not enough to get your blood pressure under control and if: Your systolic blood pressure is 130 or higher. Your diastolic blood pressure is 80 or higher. Take medicines only as told by your health care provider. Follow the directions carefully. Blood pressure medicines must be taken as told by your health care provider. The medicine does not work as well when you skip doses. Skipping doses also puts you at risk for problems. Monitoring Before you monitor your blood pressure: Do not smoke, drink caffeinated beverages, or exercise within 30 minutes before taking a measurement. Use the bathroom and empty your bladder (urinate). Sit quietly for at least 5 minutes before taking measurements. Monitor your blood pressure at home as told by your health care provider. To do this: Sit with your back straight and supported. Place your feet flat on the floor. Do not cross your legs. Support your arm on a flat surface,  such as a table. Make sure your upper arm is at heart level. Each time you measure, take two or three readings one minute apart and record the results. You may also need to have your blood pressure checked regularly by your health care provider. General information Talk with your health care provider about your diet, exercise habits, and other lifestyle factors that may be contributing to hypertension. Review all the medicines you take with your health care provider because there may be side effects or interactions. Keep all follow-up visits. Your health care provider can help you create and adjust your plan for managing your high blood pressure. Where to find more information National Heart, Lung, and Blood Institute: [employer] American Heart Association: www.heart.org Contact a health care provider if: You think you are having a reaction to medicines you have taken. You have repeated (recurrent) headaches. You feel dizzy. You have swelling in your ankles. You have trouble with your vision. Get help right away if: You develop a severe headache or confusion. You have unusual weakness or numbness, or you feel faint. You have severe pain in your chest or abdomen. You vomit repeatedly. You have trouble breathing. These symptoms may be an emergency. Get help right away. Call 911. Do not wait to see if the symptoms will go away. Do not drive yourself to the hospital. Summary Hypertension is when the force of  blood pumping through your arteries is too strong. If this condition is not controlled, it may put you at risk for serious complications. Your personal target blood pressure may vary depending on your medical conditions, your age, and other factors. For most people, a normal blood pressure is less than 120/80. Hypertension is managed by lifestyle changes, medicines, or both. Lifestyle changes to help manage hypertension include losing weight, eating a healthy, low-sodium diet,  exercising more, stopping smoking, and limiting alcohol. This information is not intended to replace advice given to you by your health care provider. Make sure you discuss any questions you have with your health care provider. Document Revised: 01/12/2021 Document Reviewed: 01/12/2021 Elsevier Patient Education  2024 [employer].

## 2024-01-14 DIAGNOSIS — I129 Hypertensive chronic kidney disease with stage 1 through stage 4 chronic kidney disease, or unspecified chronic kidney disease: Secondary | ICD-10-CM | POA: Diagnosis not present

## 2024-01-14 DIAGNOSIS — R809 Proteinuria, unspecified: Secondary | ICD-10-CM | POA: Diagnosis not present

## 2024-01-20 DIAGNOSIS — R801 Persistent proteinuria, unspecified: Secondary | ICD-10-CM | POA: Diagnosis not present

## 2024-01-20 DIAGNOSIS — N184 Chronic kidney disease, stage 4 (severe): Secondary | ICD-10-CM | POA: Diagnosis not present

## 2024-01-20 DIAGNOSIS — D631 Anemia in chronic kidney disease: Secondary | ICD-10-CM | POA: Diagnosis not present

## 2024-01-20 DIAGNOSIS — I129 Hypertensive chronic kidney disease with stage 1 through stage 4 chronic kidney disease, or unspecified chronic kidney disease: Secondary | ICD-10-CM | POA: Diagnosis not present

## 2024-02-14 ENCOUNTER — Telehealth: Payer: Self-pay

## 2024-02-14 NOTE — Telephone Encounter (Signed)
 Copied from CRM [id number]. Topic: Clinical - Medication Prior Auth >> Feb 14, 2024  1:55 PM Hannah Lee wrote: Reason for CRM: Patient called in stated needs prior auth for prescription lisinopril  (ZESTRIL ) 5 MG tablet

## 2024-02-17 ENCOUNTER — Other Ambulatory Visit (HOSPITAL_COMMUNITY): Payer: Self-pay

## 2024-02-17 NOTE — Telephone Encounter (Signed)
PA request has been Submitted.

## 2024-02-17 NOTE — Telephone Encounter (Signed)
 Pt has been notified.

## 2024-02-18 ENCOUNTER — Other Ambulatory Visit: Payer: Self-pay | Admitting: Family

## 2024-02-18 ENCOUNTER — Telehealth: Payer: Self-pay

## 2024-02-18 ENCOUNTER — Other Ambulatory Visit (HOSPITAL_COMMUNITY): Payer: Self-pay

## 2024-02-18 NOTE — Telephone Encounter (Signed)
 Pharmacy Patient Advocate Encounter  Received notification from [employer] that Prior Authorization for Lisinopril  5MG  tablets  has been APPROVED from 02/17/2024 to 05/13/2025. Ran test claim, Copay is $0. This test claim was processed through [facility]- copay amounts may vary at other pharmacies due to pharmacy/plan contracts, or as the patient moves through the different stages of their insurance plan.   PA #/Case ID/Reference #: [id number]

## 2024-02-18 NOTE — Telephone Encounter (Signed)
 Pt has been notified.

## 2024-03-27 ENCOUNTER — Ambulatory Visit
Admission: RE | Admit: 2024-03-27 | Discharge: 2024-03-27 | Disposition: A | Discharge status: Home / Self Care | Source: Ambulatory Visit | Attending: Family | Admitting: Family

## 2024-03-27 DIAGNOSIS — Z1231 Encounter for screening mammogram for malignant neoplasm of breast: Secondary | ICD-10-CM | POA: Insufficient documentation

## 2024-03-27 DIAGNOSIS — Z0000 Encounter for general adult medical examination without abnormal findings: Secondary | ICD-10-CM | POA: Diagnosis present

## 2024-12-07 ENCOUNTER — Encounter: Admitting: Family
